# Patient Record
Sex: Female | Born: 2000 | Race: White | Hispanic: Yes | Marital: Single | Smoking: Former smoker
Health system: Southern US, Community
[De-identification: ages and names within clinical notes are randomized; demographics above are authoritative.]

## PROBLEM LIST (undated history)

## (undated) DIAGNOSIS — D509 Iron deficiency anemia, unspecified: Secondary | ICD-10-CM

## (undated) HISTORY — DX: Iron deficiency anemia, unspecified: D50.9

## (undated) HISTORY — PX: NO PAST SURGERIES: SHX2092

---
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
## 2020-07-21 ENCOUNTER — Encounter (HOSPITAL_COMMUNITY): Payer: Self-pay

## 2020-07-21 ENCOUNTER — Other Ambulatory Visit: Payer: Self-pay

## 2020-07-21 ENCOUNTER — Emergency Department (HOSPITAL_COMMUNITY)
Admission: EM | Admit: 2020-07-21 | Discharge: 2020-07-21 | Disposition: A | Discharge status: Home / Self Care | Payer: Medicaid Other | Attending: Emergency Medicine | Admitting: Emergency Medicine

## 2020-07-21 ENCOUNTER — Emergency Department (HOSPITAL_COMMUNITY): Payer: Medicaid Other

## 2020-07-21 DIAGNOSIS — Z87891 Personal history of nicotine dependence: Secondary | ICD-10-CM | POA: Insufficient documentation

## 2020-07-21 DIAGNOSIS — R079 Chest pain, unspecified: Secondary | ICD-10-CM | POA: Insufficient documentation

## 2020-07-21 DIAGNOSIS — Z20822 Contact with and (suspected) exposure to covid-19: Secondary | ICD-10-CM | POA: Diagnosis not present

## 2020-07-21 DIAGNOSIS — R519 Headache, unspecified: Secondary | ICD-10-CM | POA: Diagnosis not present

## 2020-07-21 DIAGNOSIS — R0981 Nasal congestion: Secondary | ICD-10-CM | POA: Insufficient documentation

## 2020-07-21 DIAGNOSIS — R110 Nausea: Secondary | ICD-10-CM | POA: Diagnosis not present

## 2020-07-21 LAB — RESPIRATORY PANEL BY RT PCR (FLU A&B, COVID)
Influenza A by PCR: NEGATIVE
Influenza B by PCR: NEGATIVE
SARS Coronavirus 2 by RT PCR: NEGATIVE

## 2020-07-21 LAB — CBC
HCT: 34.1 % — ABNORMAL LOW (ref 36.0–46.0)
Hemoglobin: 10.3 g/dL — ABNORMAL LOW (ref 12.0–15.0)
MCH: 24 pg — ABNORMAL LOW (ref 26.0–34.0)
MCHC: 30.2 g/dL (ref 30.0–36.0)
MCV: 79.3 fL — ABNORMAL LOW (ref 80.0–100.0)
Platelets: 277 10*3/uL (ref 150–400)
RBC: 4.3 MIL/uL (ref 3.87–5.11)
RDW: 16.4 % — ABNORMAL HIGH (ref 11.5–15.5)
WBC: 6.2 10*3/uL (ref 4.0–10.5)
nRBC: 0 % (ref 0.0–0.2)

## 2020-07-21 LAB — BASIC METABOLIC PANEL
Anion gap: 8 (ref 5–15)
BUN: 9 mg/dL (ref 6–20)
CO2: 25 mmol/L (ref 22–32)
Calcium: 8.6 mg/dL — ABNORMAL LOW (ref 8.9–10.3)
Chloride: 106 mmol/L (ref 98–111)
Creatinine, Ser: 0.41 mg/dL — ABNORMAL LOW (ref 0.44–1.00)
GFR, Estimated: >60 mL/min (ref >60)
Glucose, Bld: 97 mg/dL (ref 70–99)
Potassium: 4.1 mmol/L (ref 3.5–5.1)
Sodium: 139 mmol/L (ref 135–145)

## 2020-07-21 LAB — TROPONIN I (HIGH SENSITIVITY)
Troponin I (High Sensitivity): <2 ng/L (ref <18)
Troponin I (High Sensitivity): 2 ng/L (ref <18)

## 2020-07-21 LAB — I-STAT BETA HCG BLOOD, ED (MC, WL, AP ONLY): I-stat hCG, quantitative: <5 m[IU]/mL (ref <5)

## 2020-07-21 MED ORDER — PROCHLORPERAZINE EDISYLATE 10 MG/2ML IJ SOLN
10.0000 mg | Freq: Once | INTRAMUSCULAR | Status: AC
  Administered 2020-07-21: 10 mg via INTRAVENOUS
  Filled 2020-07-21: qty 2

## 2020-07-21 MED ORDER — DIPHENHYDRAMINE HCL 50 MG/ML IJ SOLN
25.0000 mg | Freq: Once | INTRAMUSCULAR | Status: AC
  Administered 2020-07-21: 25 mg via INTRAVENOUS
  Filled 2020-07-21: qty 1

## 2020-07-21 NOTE — ED Triage Notes (Addendum)
Pt presents with c/o headache since 4 am, unrelieved by pain reliever. Pt also reports chest pain that started around 11 am.

## 2020-07-21 NOTE — ED Provider Notes (Signed)
[facility]-EMERGENCY DEPT Provider Note   CSN: [id number] Arrival date & time: 07/21/20  1228     History Chief Complaint  Patient presents with  . Headache  . Chest Pain    Paula [facility] is a 19 y.o. female.  HPI      Woke with a headache this morning at 4AM Worsened and at ;7AM was 9/10 in severity Chest pain started at 11AM, is 3/10 now  Pressure like headache, took ibuprofen which helped for a little but it came back NO falls or trauma, no fever, Denies numbness, weakness, difficulty talking or walking, visual changes or facial droop. Nausea this AM, no vomiting Has had some headaches in the past but not this severe WHen she began having CP, discussed with her mom who recommended  Coming to the ED  Pressure and sharp pain in middle of chest, radiation to the back some, a little worse with breathing and changing positions, worse with the movements but not particular position-action of layign down or sitting up No dyspnea, nausea,  Heart raciing, leg pain or swelling  No fam hx of early heart attacks, blood clots, aneurysms  No smoking, no other drugs, no OCP, no recent travel or surgeries  Mild runny nose when woke this AM. No cough, sore throat, body aches Change in appetite for about a week No sick contacts  Past Medical History:  Diagnosis Date  . Microcytic anemia 09/12/2018    Patient Active Problem List   Diagnosis Date Noted  . Numbness and tingling 09/17/2018  . Microcytic anemia 09/12/2018  . Birth control counseling 02/26/2017  . 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 Insulin resistance 01/06/2014  . Hyperlipidemia 03/16/2012  . HEARING LOSS, LEFT EAR 02/08/2010  . CHILDHOOD OBESITY 12/11/2009    History reviewed. No pertinent surgical history.   OB History   No obstetric history on file.     Family History  Problem Relation Age of Onset  . Diabetes Mother   . Healthy Father     Social History    Tobacco Use  . Smoking status: Former Games developer  . Smokeless tobacco: Never Used  . Tobacco comment: pt states it has been months  Vaping Use  . Vaping Use: Never used  Substance Use Topics  . Alcohol use: Not Currently  . Drug use: Not Currently    Types: Marijuana    Home Medications Prior to Admission medications   Medication Sig Start Date End Date Taking? Authorizing Provider  cephALEXin (KEFLEX) 500 MG capsule Take 1 capsule (500 mg total) by mouth 2 (two) times daily. 05/27/19   Roxy Horseman, PA-C  ferrous sulfate 325 (65 FE) MG tablet Take 1 tablet (325 mg total) by mouth daily. Patient not taking: Reported on 05/27/2019 10/07/18   Bethel Born, PA-C  Multiple Vitamins-Iron (MULTIVITAMINS WITH IRON) TABS tablet Take 1 tablet by mouth daily. Patient not taking: Reported on 05/27/2019 09/12/18   Lorin Picket, NP    Allergies    Patient has no known allergies.  Review of Systems   Review of Systems  Constitutional: Positive for appetite change. Negative for fever.  HENT: Positive for rhinorrhea. Negative for congestion and sore throat.   Eyes: Negative for visual disturbance.  Respiratory: Negative for cough and shortness of breath.   Cardiovascular: Positive for chest pain.  Gastrointestinal: Positive for nausea. Negative for abdominal pain, diarrhea and vomiting.  Genitourinary: Negative for difficulty urinating.  Musculoskeletal: Negative for back pain and neck  pain.  Skin: Negative for rash.  Neurological: Positive for headaches. Negative for dizziness, syncope, facial asymmetry and weakness.    Physical Exam Updated Vital Signs BP 138/75   Pulse (!) 119   Temp 98.4 F (36.9 C) (Oral)   Resp 18   Ht 5\' 3"  (1.6 m)   Wt 99.8 kg   LMP 07/13/2020 (Approximate)   SpO2 (!) 89%   BMI 38.97 kg/m   Physical Exam Vitals and nursing note reviewed.  Constitutional:      General: She is not in acute distress.    Appearance: She is well-developed. She is  not diaphoretic.  HENT:     Head: Normocephalic and atraumatic.  Eyes:     Conjunctiva/sclera: Conjunctivae normal.  Cardiovascular:     Rate and Rhythm: Normal rate and regular rhythm.     Heart sounds: Normal heart sounds. No murmur heard.  No friction rub. No gallop.   Pulmonary:     Effort: Pulmonary effort is normal. No respiratory distress.     Breath sounds: Normal breath sounds. No wheezing or rales.  Abdominal:     General: There is no distension.     Palpations: Abdomen is soft.     Tenderness: There is no abdominal tenderness. There is no guarding.  Musculoskeletal:        General: No tenderness.     Cervical back: Normal range of motion.  Skin:    General: Skin is warm and dry.     Findings: No erythema or rash.  Neurological:     Mental Status: She is alert and oriented to person, place, and time.     ED Results / Procedures / Treatments   Labs (all labs ordered are listed, but only abnormal results are displayed) Labs Reviewed  BASIC METABOLIC PANEL - Abnormal; Notable for the following components:      Result Value   Creatinine, Ser 0.41 (*)    Calcium 8.6 (*)    All other components within normal limits  CBC - Abnormal; Notable for the following components:   Hemoglobin 10.3 (*)    HCT 34.1 (*)    MCV 79.3 (*)    MCH 24.0 (*)    RDW 16.4 (*)    All other components within normal limits  RESPIRATORY PANEL BY RT PCR (FLU A&B, COVID)  I-STAT BETA HCG BLOOD, ED ([facility], [facility], [facility] ONLY)  TROPONIN I (HIGH SENSITIVITY)  TROPONIN I (HIGH SENSITIVITY)    EKG EKG Interpretation  Date/Time:  Friday July 21 2020 12:46:02 EDT Ventricular Rate:  74 PR Interval:    QRS Duration: 99 QT Interval:  386 QTC Calculation: 429 R Axis:   64 Text Interpretation: Sinus rhythm Baseline wander in lead(s) V4 Similar QW prior ECG Confirmed by [id number] (Alvira Monday) on 07/21/2020 1:05:56 PM   Radiology DG Chest 2 View  Result Date: 07/21/2020 CLINICAL DATA:  Chest  pain.  Headache. EXAM: CHEST - 2 VIEW COMPARISON:  None. FINDINGS: The heart size and mediastinal contours are within normal limits. Both lungs are clear. The visualized skeletal structures are unremarkable. IMPRESSION: Normal examination. Electronically Signed   By: 13/01/2020 M.D.   On: 07/21/2020 13:47    Procedures Procedures (including critical care time)  Medications Ordered in ED Medications  prochlorperazine (COMPAZINE) injection 10 mg (10 mg Intravenous Given 07/21/20 1436)  diphenhydrAMINE (BENADRYL) injection 25 mg (25 mg Intravenous Given 07/21/20 1436)    ED Course  I have reviewed the triage vital signs and  the nursing notes.  Pertinent labs & imaging results that were available during my care of the patient were reviewed by me and considered in my medical decision making (see chart for details).    MDM Rules/Calculators/A&P                          19 year old female with history of hyperlipidemia and insulin resistence presents with concern of chest pain. Differential diagnosis for chest pain includes pulmonary embolus, dissection, pneumothorax, pneumonia, ACS, myocarditis, pericarditis.  EKG was done and evaluate by me and showed no acute ST changes and no signs of pericarditis. Chest x-ray was done and evaluated by me and radiology and showed no sign of pneumonia or pneumothorax. Patient is PERC negative and low risk Wells and have low suspicion for PE.  Patient is low risk HEART score and had delta troponins which were both negative.  Do not feel history or exam are consistent with aortic dissection. Also reports headache---Headache began slowly, no trauma, no fevers, and normal neurologic exam and have low suspicion for [facility], SDH or meningitis.  Patient was given compazine and benadryl with improvement in headache. COVID 19 testing negative. Discussed all results and recommend outpt follow up. Patient discharged in stable condition with understanding of reasons to return.       Final Clinical Impression(s) / ED Diagnoses Final diagnoses:  Chest pain, unspecified type  Acute nonintractable headache, unspecified headache type    Rx / DC Orders ED Discharge Orders    None       Alvira Monday, MD 07/22/20 2354

## 2020-07-24 ENCOUNTER — Telehealth: Payer: Self-pay

## 2020-07-24 NOTE — Telephone Encounter (Signed)
Transition Care Management Unsuccessful Follow-up Telephone Call  Date of discharge and from where:  07/21/2020 [facility]  Attempts:  1st Attempt  Reason for unsuccessful TCM follow-up call:  Left voice message

## 2020-07-25 NOTE — Telephone Encounter (Signed)
Transition Care Management Unsuccessful Follow-up Telephone Call  Date of discharge and from where:  07/21/2020 [facility]  Attempts:  2nd Attempt  Reason for unsuccessful TCM follow-up call:  Left voice message

## 2020-07-26 NOTE — Telephone Encounter (Signed)
Transition Care Management Unsuccessful Follow-up Telephone Call  Date of discharge and from where:  07/21/2020 [facility]  Attempts:  3rd Attempt  Reason for unsuccessful TCM follow-up call:  Left voice message

## 2020-10-24 DIAGNOSIS — H5213 Myopia, bilateral: Secondary | ICD-10-CM | POA: Diagnosis not present

## 2020-11-28 DIAGNOSIS — H5213 Myopia, bilateral: Secondary | ICD-10-CM | POA: Diagnosis not present

## 2020-11-28 DIAGNOSIS — H52223 Regular astigmatism, bilateral: Secondary | ICD-10-CM | POA: Diagnosis not present

## 2020-12-31 ENCOUNTER — Emergency Department (HOSPITAL_COMMUNITY): Payer: Medicaid Other

## 2020-12-31 ENCOUNTER — Encounter (HOSPITAL_COMMUNITY): Payer: Self-pay

## 2020-12-31 ENCOUNTER — Emergency Department (HOSPITAL_COMMUNITY)
Admission: EM | Admit: 2020-12-31 | Discharge: 2020-12-31 | Disposition: A | Discharge status: Home / Self Care | Payer: Medicaid Other | Attending: Emergency Medicine | Admitting: Emergency Medicine

## 2020-12-31 ENCOUNTER — Other Ambulatory Visit: Payer: Self-pay

## 2020-12-31 DIAGNOSIS — R0602 Shortness of breath: Secondary | ICD-10-CM | POA: Diagnosis not present

## 2020-12-31 DIAGNOSIS — M545 Low back pain, unspecified: Secondary | ICD-10-CM | POA: Diagnosis not present

## 2020-12-31 DIAGNOSIS — Z87891 Personal history of nicotine dependence: Secondary | ICD-10-CM | POA: Diagnosis not present

## 2020-12-31 DIAGNOSIS — R079 Chest pain, unspecified: Secondary | ICD-10-CM | POA: Diagnosis not present

## 2020-12-31 DIAGNOSIS — R0600 Dyspnea, unspecified: Secondary | ICD-10-CM | POA: Diagnosis not present

## 2020-12-31 DIAGNOSIS — M5459 Other low back pain: Secondary | ICD-10-CM | POA: Diagnosis not present

## 2020-12-31 LAB — URINALYSIS, ROUTINE W REFLEX MICROSCOPIC
Bacteria, UA: NONE SEEN
Bilirubin Urine: NEGATIVE
Glucose, UA: NEGATIVE mg/dL
Ketones, ur: NEGATIVE mg/dL
Nitrite: NEGATIVE
Protein, ur: NEGATIVE mg/dL
Specific Gravity, Urine: >1.046 — ABNORMAL HIGH (ref 1.005–1.030)
pH: 5 (ref 5.0–8.0)

## 2020-12-31 LAB — BASIC METABOLIC PANEL
Anion gap: 7 (ref 5–15)
BUN: 11 mg/dL (ref 6–20)
CO2: 25 mmol/L (ref 22–32)
Calcium: 9.1 mg/dL (ref 8.9–10.3)
Chloride: 107 mmol/L (ref 98–111)
Creatinine, Ser: 0.46 mg/dL (ref 0.44–1.00)
GFR, Estimated: >60 mL/min (ref >60)
Glucose, Bld: 93 mg/dL (ref 70–99)
Potassium: 4.5 mmol/L (ref 3.5–5.1)
Sodium: 139 mmol/L (ref 135–145)

## 2020-12-31 LAB — CBC
HCT: 35.7 % — ABNORMAL LOW (ref 36.0–46.0)
Hemoglobin: 11 g/dL — ABNORMAL LOW (ref 12.0–15.0)
MCH: 24.3 pg — ABNORMAL LOW (ref 26.0–34.0)
MCHC: 30.8 g/dL (ref 30.0–36.0)
MCV: 79 fL — ABNORMAL LOW (ref 80.0–100.0)
Platelets: 273 10*3/uL (ref 150–400)
RBC: 4.52 MIL/uL (ref 3.87–5.11)
RDW: 16.4 % — ABNORMAL HIGH (ref 11.5–15.5)
WBC: 6.4 10*3/uL (ref 4.0–10.5)
nRBC: 0 % (ref 0.0–0.2)

## 2020-12-31 LAB — D-DIMER, QUANTITATIVE: D-Dimer, Quant: 0.55 ug/mL-FEU — ABNORMAL HIGH (ref 0.00–0.50)

## 2020-12-31 LAB — PREGNANCY, URINE: Preg Test, Ur: NEGATIVE

## 2020-12-31 MED ORDER — IOHEXOL 350 MG/ML SOLN
100.0000 mL | Freq: Once | INTRAVENOUS | Status: AC | PRN
  Administered 2020-12-31: 100 mL via INTRAVENOUS

## 2020-12-31 MED ORDER — NAPROXEN 500 MG PO TABS: 500.0000 mg | ORAL_TABLET | Freq: Two times a day (BID) | ORAL | 0 refills | Status: DC

## 2020-12-31 MED ORDER — CYCLOBENZAPRINE HCL 10 MG PO TABS: 10.0000 mg | ORAL_TABLET | Freq: Three times a day (TID) | ORAL | 0 refills | Status: DC

## 2020-12-31 NOTE — Discharge Instructions (Signed)
Work-up for the shortness of breath without any acute findings.  Chest x-ray negative.  CT angio chest negative for any blood clots in the lungs.  Urinalysis questionable urinary tract infection but most likely contamination.  Sent for culture.  To be contacted if it is showing a significant infection.  X-rays of the low part of the back without any bony abnormalities.  Take the Naprosyn and the Flexeril as directed for the next 7 days.  Return for any new or worse symptoms or things are not improving.

## 2020-12-31 NOTE — ED Triage Notes (Addendum)
Patient c/o lower back pain x 2-3 weeks. Patient also c/o SOB and chest pain ( x 1 episode) x 1 week.

## 2020-12-31 NOTE — ED Notes (Signed)
Pt states chest pain was 2 weeks ago not today

## 2020-12-31 NOTE — ED Provider Notes (Signed)
[facility]   CSN: [id number] Arrival date & time: 12/31/20  1519     History Chief Complaint  Patient presents with  . Back Pain  . Chest Pain  . Shortness of Breath    Paula [facility] is a 20 y.o. female.  Patient with a complaint of low back pain for about 3 weeks.  But worse in the past few days and shortness of breath started yesterday.  Patient had an episode of chest pain about 2 weeks ago has had none since.  Patient denies any numbness or weakness or any pain radiating into her legs.  No fall or injury.        Past Medical History:  Diagnosis Date  . Microcytic anemia 09/12/2018    Patient Active Problem List   Diagnosis Date Noted  . Numbness and tingling 09/17/2018  . Microcytic anemia 09/12/2018  . Birth control counseling 02/26/2017  . 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 Insulin resistance 01/06/2014  . Hyperlipidemia 03/16/2012  . HEARING LOSS, LEFT EAR 02/08/2010  . CHILDHOOD OBESITY 12/11/2009    History reviewed. No pertinent surgical history.   OB History   No obstetric history on file.     Family History  Problem Relation Age of Onset  . Diabetes Mother   . Healthy Father     Social History   Tobacco Use  . Smoking status: Former Games developer  . Smokeless tobacco: Never Used  . Tobacco comment: pt states it has been months  Vaping Use  . Vaping Use: Every day  . Substances: Nicotine, Flavoring  Substance Use Topics  . Alcohol use: Not Currently  . Drug use: Not Currently    Types: Marijuana    Home Medications Prior to Admission medications   Medication Sig Start Date End Date Taking? Authorizing Provider  cephALEXin (KEFLEX) 500 MG capsule Take 1 capsule (500 mg total) by mouth 2 (two) times daily. 05/27/19   Roxy Horseman, PA-C  ferrous sulfate 325 (65 FE) MG tablet Take 1 tablet (325 mg total) by mouth daily. Patient not taking: Reported on 05/27/2019 10/07/18    Bethel Born, PA-C  Multiple Vitamins-Iron (MULTIVITAMINS WITH IRON) TABS tablet Take 1 tablet by mouth daily. Patient not taking: Reported on 05/27/2019 09/12/18   Lorin Picket, NP    Allergies    Patient has no known allergies.  Review of Systems   Review of Systems  Constitutional: Negative for chills and fever.  HENT: Negative for rhinorrhea and sore throat.   Eyes: Negative for visual disturbance.  Respiratory: Positive for shortness of breath. Negative for cough.   Cardiovascular: Negative for chest pain and leg swelling.  Gastrointestinal: Negative for abdominal pain, diarrhea, nausea and vomiting.  Genitourinary: Negative for dysuria.  Musculoskeletal: Positive for back pain. Negative for neck pain.  Skin: Negative for rash.  Neurological: Negative for dizziness, light-headedness and headaches.  Hematological: Does not bruise/bleed easily.  Psychiatric/Behavioral: Negative for confusion.    Physical Exam Updated Vital Signs BP 123/86 (BP Location: Left Arm)   Pulse 75   Temp 98.4 F (36.9 C) (Oral)   Resp 16   Ht 1.6 m (5\' 3" )   Wt 119.3 kg   LMP 12/27/2020   SpO2 100%   BMI 46.59 kg/m   Physical Exam Vitals and nursing note reviewed.  Constitutional:      General: She is not in acute distress.    Appearance: Normal appearance. She is well-developed.  HENT:  Head: Normocephalic and atraumatic.  Eyes:     Extraocular Movements: Extraocular movements intact.     Conjunctiva/sclera: Conjunctivae normal.     Pupils: Pupils are equal, round, and reactive to light.  Cardiovascular:     Rate and Rhythm: Normal rate and regular rhythm.     Heart sounds: No murmur heard.   Pulmonary:     Effort: Pulmonary effort is normal. No respiratory distress.     Breath sounds: Normal breath sounds.  Abdominal:     Palpations: Abdomen is soft.     Tenderness: There is no abdominal tenderness.  Musculoskeletal:        General: No swelling. Normal range of  motion.     Cervical back: Normal range of motion and neck supple.     Comments: Bilateral paraspinous tenderness to the lumbar back.  Lower extremities without any pain with range of motion.  No weakness or numbness to the feet.  Good cap refill.  Skin:    General: Skin is warm and dry.     Capillary Refill: Capillary refill takes less than 2 seconds.  Neurological:     General: No focal deficit present.     Mental Status: She is alert and oriented to person, place, and time.     Cranial Nerves: No cranial nerve deficit.     Sensory: No sensory deficit.     Motor: No weakness.     ED Results / Procedures / Treatments   Labs (all labs ordered are listed, but only abnormal results are displayed) Labs Reviewed - No data to display  EKG None  Radiology No results found.  Procedures Procedures   Medications Ordered in ED Medications - No data to display  ED Course  I have reviewed the triage vital signs and the nursing notes.  Pertinent labs & imaging results that were available during my care of the patient were reviewed by me and considered in my medical decision making (see chart for details).    MDM Rules/Calculators/A&P                          Patient work-up for the shortness of breath chest x-ray was negative.  The D-dimer was elevated so CT angio was done without evidence of any acute findings or pulmonary embolus.  Urinalysis appears to be perhaps contaminated.  Sent for culture patient will be notified if it is consistent with a urinary tract infection.  Pregnancy test negative.  No acute findings regarding the shortness of breath.  For the low back lumbar x-rays were done without any acute findings.  Patient not showing any evidence of sciatica.  CBC and labs without any significant abnormality.  For the back patient will be treated symptomatically with Naprosyn and Flexeril.  Patient does not need a work note.  Patient will return for any new or worse  symptoms.  Patient knows she will be notified if the urine culture grows significant amount of bacteria consistent with infection peer Final Clinical Impression(s) / ED Diagnoses Final diagnoses:  None    Rx / DC Orders ED Discharge Orders    None       Vanetta Mulders, MD 12/31/20 2021

## 2021-01-01 ENCOUNTER — Telehealth: Payer: Self-pay | Admitting: *Deleted

## 2021-01-01 NOTE — Telephone Encounter (Signed)
Transition Care Management Follow-up Telephone Call  Date of discharge and from where: 12/31/2020 - [facility]  How have you been since you were released from the hospital? "Still in some pain"  Any questions or concerns? No  Items Reviewed:  Did the pt receive and understand the discharge instructions provided? Yes   Medications obtained and verified? Yes   Other? No   Any new allergies since your discharge? No   Dietary orders reviewed? No  Do you have support at home? Yes     Functional Questionnaire: (I = Independent and D = Dependent) ADLs: I  Bathing/Dressing- I  Meal Prep- I  Eating- I  Maintaining continence- I  Transferring/Ambulation- I  Managing Meds- I  Follow up appointments reviewed:   PCP Hospital f/u appt confirmed? No    Specialist Hospital f/u appt confirmed? No    Are transportation arrangements needed? No   If their condition worsens, is the pt aware to call PCP or go to the Emergency Dept.? Yes  Was the patient provided with contact information for the PCP's office or ED? Yes  Was to pt encouraged to call back with questions or concerns? Yes

## 2021-01-02 LAB — URINE CULTURE

## 2021-04-26 ENCOUNTER — Encounter (HOSPITAL_COMMUNITY): Payer: Self-pay

## 2021-04-26 ENCOUNTER — Emergency Department (HOSPITAL_COMMUNITY)
Admission: EM | Admit: 2021-04-26 | Discharge: 2021-04-26 | Disposition: A | Discharge status: Home / Self Care | Payer: Medicaid Other | Attending: Student | Admitting: Student

## 2021-04-26 ENCOUNTER — Emergency Department (HOSPITAL_COMMUNITY): Payer: Medicaid Other

## 2021-04-26 ENCOUNTER — Other Ambulatory Visit: Payer: Self-pay

## 2021-04-26 DIAGNOSIS — O4690 Antepartum hemorrhage, unspecified, unspecified trimester: Secondary | ICD-10-CM

## 2021-04-26 DIAGNOSIS — O23591 Infection of other part of genital tract in pregnancy, first trimester: Secondary | ICD-10-CM | POA: Insufficient documentation

## 2021-04-26 DIAGNOSIS — R102 Pelvic and perineal pain: Secondary | ICD-10-CM | POA: Insufficient documentation

## 2021-04-26 DIAGNOSIS — O418X90 Other specified disorders of amniotic fluid and membranes, unspecified trimester, not applicable or unspecified: Secondary | ICD-10-CM

## 2021-04-26 DIAGNOSIS — N8312 Corpus luteum cyst of left ovary: Secondary | ICD-10-CM | POA: Diagnosis not present

## 2021-04-26 DIAGNOSIS — O208 Other hemorrhage in early pregnancy: Secondary | ICD-10-CM | POA: Diagnosis present

## 2021-04-26 DIAGNOSIS — O368910 Maternal care for other specified fetal problems, first trimester, not applicable or unspecified: Secondary | ICD-10-CM | POA: Diagnosis not present

## 2021-04-26 DIAGNOSIS — Z87891 Personal history of nicotine dependence: Secondary | ICD-10-CM | POA: Insufficient documentation

## 2021-04-26 DIAGNOSIS — O209 Hemorrhage in early pregnancy, unspecified: Secondary | ICD-10-CM | POA: Diagnosis not present

## 2021-04-26 DIAGNOSIS — Z3A01 Less than 8 weeks gestation of pregnancy: Secondary | ICD-10-CM | POA: Diagnosis not present

## 2021-04-26 LAB — HCG, QUANTITATIVE, PREGNANCY: hCG, Beta Chain, Quant, S: 66057 m[IU]/mL — ABNORMAL HIGH (ref <5)

## 2021-04-26 LAB — URINALYSIS, ROUTINE W REFLEX MICROSCOPIC
Bilirubin Urine: NEGATIVE
Glucose, UA: NEGATIVE mg/dL
Ketones, ur: 20 mg/dL — AB
Nitrite: NEGATIVE
Protein, ur: NEGATIVE mg/dL
Specific Gravity, Urine: 1.028 (ref 1.005–1.030)
pH: 5 (ref 5.0–8.0)

## 2021-04-26 LAB — I-STAT CHEM 8, ED
BUN: 12 mg/dL (ref 6–20)
Calcium, Ion: 1.26 mmol/L (ref 1.15–1.40)
Chloride: 103 mmol/L (ref 98–111)
Creatinine, Ser: 0.4 mg/dL — ABNORMAL LOW (ref 0.44–1.00)
Glucose, Bld: 91 mg/dL (ref 70–99)
HCT: 33 % — ABNORMAL LOW (ref 36.0–46.0)
Hemoglobin: 11.2 g/dL — ABNORMAL LOW (ref 12.0–15.0)
Potassium: 3.9 mmol/L (ref 3.5–5.1)
Sodium: 137 mmol/L (ref 135–145)
TCO2: 24 mmol/L (ref 22–32)

## 2021-04-26 LAB — I-STAT BETA HCG BLOOD, ED (MC, WL, AP ONLY): I-stat hCG, quantitative: >2000 m[IU]/mL — ABNORMAL HIGH (ref <5)

## 2021-04-26 LAB — WET PREP, GENITAL
Clue Cells Wet Prep HPF POC: NONE SEEN
Sperm: NONE SEEN
Trich, Wet Prep: NONE SEEN
WBC, Wet Prep HPF POC: NONE SEEN
Yeast Wet Prep HPF POC: NONE SEEN

## 2021-04-26 LAB — HCG, SERUM, QUALITATIVE: Preg, Serum: POSITIVE — AB

## 2021-04-26 LAB — ABO/RH: ABO/RH(D): A POS

## 2021-04-26 MED ORDER — CEPHALEXIN 500 MG PO CAPS: 500.0000 mg | ORAL_CAPSULE | Freq: Four times a day (QID) | ORAL | 0 refills | Status: AC

## 2021-04-26 NOTE — ED Triage Notes (Signed)
Pt reports + pregnancy test yesterday and woke up this am with spotting when using restroom with lower abd pain.

## 2021-04-26 NOTE — ED Notes (Signed)
Two Gold Top Tubes sent to lab.

## 2021-04-26 NOTE — ED Provider Notes (Addendum)
Emergency Medicine Provider Triage Evaluation Note  [facility] , a 20 y.o. female  was evaluated in triage.  Pt complains of vaginal bleeding and mild pelvic pain (resolved). She took some at home pregnancy tests and they were positive. LMP 6/21.  Review of Systems  Positive: Vaginal bleeding, pelvic pain Negative: fever  Physical Exam  BP (!) 144/97   Pulse 72   Temp 98.4 F (36.9 C)   Resp 20   Ht 5\' 3"  (1.6 m)   Wt 119 kg   LMP 12/27/2020 Comment: neg preg test  SpO2 100%   BMI 46.47 kg/m  Gen:   Awake, no distress   Resp:  Normal effort  MSK:   Moves extremities without difficulty    Medical Decision Making  Medically screening exam initiated at 1:07 PM.  Appropriate orders placed.  12/29/2020 was informed that the remainder of the evaluation will be completed by another provider, this initial triage assessment does not replace that evaluation, and the importance of remaining in the ED until their evaluation is complete.     Paula Parsley, PA-C 04/26/21 1308    06/26/21, PA-C 04/26/21 1900    06/26/21, MD 04/28/21 [id number]

## 2021-04-26 NOTE — Discharge Instructions (Addendum)
You were noted to have an intrauterine pregnancy.  You will need to follow-up with an OB/GYN to have a repeat beta quant completed.  You did have some white blood cells in your urine today and we are going to treat you for potential urinary tract infection with antibiotics.  A culture will be sent of your urine and if you need to change or stop antibiotics in the hospital will contact you.  Additionally you were tested for gonorrhea and chlamydia and the results will be available in the next 3 to 5 days.  Your trichomonas, yeast and bacterial vaginosis tests were negative.  Please start taking a prenatal vitamin.  You can buy these over-the-counter.  Please contact the women's health care center to schedule an appointment for follow-up and return to the emergency department for any new or worsening symptoms in the meantime.

## 2021-04-26 NOTE — ED Notes (Signed)
Lavender and Light Green top tube sent to lab.

## 2021-04-26 NOTE — ED Provider Notes (Signed)
Paula Noble   CSN: [id number] Arrival date & time: 04/26/21  1236     History Chief Complaint  Patient presents with   Vaginal Bleeding    Paula Noble is a 20 y.o. female.  HPI  20 year old female presents to the emergency department today for evaluation of vaginal bleeding.  States that she took several pregnancy tests at home that were positive.  She did started having some spotting today.  Denies any heavy bleeding.  Denies any recent sexual intercourse.  Her last menstrual cycle was in June of this year.  This to be her first pregnancy.  She denies any vaginal discharge, urinary symptoms, abdominal pain nausea or vomiting.  She has had some pelvic cramping which has been mild.  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 No pertinent surgical history.   OB History     Gravida  1   Para      Term      Preterm      AB      Living         SAB      IAB      Ectopic      Multiple      Live Births              Family History  Problem Relation Age of Onset   Diabetes Mother    Healthy Father     Social History   Tobacco Use   Smoking status: Former   Smokeless tobacco: Never   Tobacco comments:    pt states it has been months  Vaping Use   Vaping Use: Every day   Substances: Nicotine, Flavoring  Substance Use Topics   Alcohol use: Not Currently   Drug use: Not Currently    Types: Marijuana    Home Medications Prior to Admission medications   Medication Sig Start Date End Date Taking? Authorizing Provider   cephALEXin (KEFLEX) 500 MG capsule Take 1 capsule (500 mg total) by mouth 4 (four) times daily for 7 days. 04/26/21 05/03/21 Yes Tilla Wilborn S, PA-C  cephALEXin (KEFLEX) 500 MG capsule Take 1 capsule (500 mg total) by mouth 2 (two) times daily. Patient not taking: Reported on 12/31/2020 05/27/19   Roxy Horseman, PA-C  cyclobenzaprine (FLEXERIL) 10 MG tablet Take 1 tablet (10 mg total) by mouth 3 (three) times daily. 12/31/20   Vanetta Mulders, MD  ferrous sulfate 325 (65 FE) MG tablet Take 1 tablet (325 mg total) by mouth daily. Patient not taking: No sig reported 10/07/18   Bethel Born, PA-C  Multiple Vitamins-Iron (MULTIVITAMINS WITH IRON) TABS tablet Take 1 tablet by mouth daily. Patient not taking: No sig reported 09/12/18   Lorin Picket, NP  naproxen (NAPROSYN) 500 MG tablet Take 1 tablet (500 mg total) by mouth 2 (two) times daily. 12/31/20   Vanetta Mulders, MD    Allergies    Patient has no known allergies.  Review of Systems   Review of Systems  Constitutional:  Negative for fever.  Eyes:  Negative for visual disturbance.  Respiratory:  Negative for shortness of breath.   Cardiovascular:  Negative for chest pain.  Gastrointestinal:  Negative for abdominal pain, diarrhea, nausea and vomiting.  Genitourinary:  Positive for vaginal bleeding. Negative for dysuria, pelvic pain and vaginal discharge.  Musculoskeletal:  Negative for back pain.   Physical Exam Updated Vital Signs BP 115/78   Pulse 64   Temp 98 F (36.7 C) (Oral)   Resp 18   Ht 5\' 3"  (1.6 m)   Wt 119 kg   LMP 12/27/2020 Comment: neg preg test  SpO2 100%   BMI 46.47 kg/m   Physical Exam Vitals and nursing Noble reviewed.  Constitutional:      General: She is not in acute distress.    Appearance: She is well-developed.  HENT:     Head: Normocephalic and atraumatic.  Eyes:     Conjunctiva/sclera: Conjunctivae normal.  Cardiovascular:     Rate and Rhythm: Normal rate and regular rhythm.      Heart sounds: Normal heart sounds. No murmur heard. Pulmonary:     Effort: Pulmonary effort is normal. No respiratory distress.     Breath sounds: Normal breath sounds.  Abdominal:     General: Bowel sounds are normal.     Palpations: Abdomen is soft.     Tenderness: There is no abdominal tenderness.  Genitourinary:    Comments: Exam performed by 12/29/2020,  exam chaperoned Date: 04/26/2021 Pelvic exam: normal external genitalia without evidence of trauma. VULVA: normal appearing vulva with no masses, tenderness or lesion. VAGINA: normal appearing vagina with normal color and discharge, no lesions. CERVIX: normal appearing cervix without lesions, cervical motion tenderness absent, cervical os closed with out purulent discharge; vaginal discharge - none, scant vaginal bleeding present, Wet prep and DNA probe for chlamydia and GC obtained.   ADNEXA: normal adnexa in size, nontender and no masses UTERUS: uterus is normal size, shape, consistency and nontender.   Musculoskeletal:     Cervical back: Neck supple.  Skin:    General: Skin is warm and dry.  Neurological:     Mental Status: She is alert.    ED Results / Procedures / Treatments   Labs (all labs ordered are listed, but only abnormal results are displayed) Labs Reviewed  HCG, SERUM, QUALITATIVE - Abnormal; Notable for the following components:      Result Value   Preg, Serum POSITIVE (*)    All other components within normal limits  URINALYSIS, ROUTINE W REFLEX MICROSCOPIC - Abnormal; Notable for the following components:   APPearance HAZY (*)    Hgb urine dipstick LARGE (*)    Ketones, ur 20 (*)    Leukocytes,Ua TRACE (*)    Bacteria, UA RARE (*)    All other components within normal limits  HCG, QUANTITATIVE, PREGNANCY - Abnormal; Notable for the following components:   hCG, Beta Chain, Quant, S 66,057 (*)    All other components within normal limits  I-STAT BETA HCG BLOOD, ED ([facility], [facility], [facility] ONLY) - Abnormal;  Notable for the following components:   I-stat hCG, quantitative >2,000.0 (*)    All other components within normal limits  I-STAT CHEM 8, ED - Abnormal; Notable for the following components:   Creatinine, Ser 0.40 (*)    Hemoglobin 11.2 (*)    HCT 33.0 (*)    All other components within normal limits  WET PREP, GENITAL  URINE CULTURE  ABO/RH  GC/CHLAMYDIA PROBE AMP ([place]) NOT AT [facility]    EKG  None  Radiology US OB Comp < 14 Wks  Result Date: 04/26/2021 CLINICAL DATA:  Pregnant with vaginal bleeding EXAM: OBSTETRIC <14 WK ULTRASOUND TECHNIQUE: Transabdominal ultrasound was performed for evaluation of the gestation as well as the maternal uterus and adnexal regions. COMPARISON:  None. FINDINGS: Intrauterine gestational sac: Present Yolk sac:  Absent Embryo:  Absent Cardiac Activity: Absent MSD:  19 mm   6 w   6 d Subchorionic hemorrhage: Suspect trace subchorionic hemorrhage superiorly including on image 58 of the initial cine loop. Maternal uterus/adnexae: Right ovary not visualized. Left ovarian corpus luteal cyst of 2.3 x 2.5 x 1.8 cm. No significant free fluid. IMPRESSION: 1. Intrauterine gestational sac of 19 mm, corresponding to 6 weeks 6 days. Lack of yolk sac or fetal pole, likely due to early gestational age. 2. Trace subchorionic hemorrhage suspected. 3. Left ovarian corpus luteal cyst. Electronically Signed   By: Jeronimo Greaves M.D.   On: 04/26/2021 15:05    Procedures Procedures   Medications Ordered in ED Medications - No data to display  ED Course  I have reviewed the triage vital signs and the nursing notes.  Pertinent labs & imaging results that were available during my care of the patient were reviewed by me and considered in my medical decision making (see chart for details).    MDM Rules/Calculators/A&P                          20 year old female presenting to the emergency department today for evaluation of vaginal bleeding in pregnancy.  Her last menstrual  period was June/2022.  She has some mild pelvic pain as well.  hCG i-STAT is greater than 2000.  She does have some pyuria noted on her urinalysis, will send culture and treat empirically.  She has a very mild anemia on her Chem-8.  Rh status is positive therefore she will not require rhogham.  Wet prep does not show any evidence of yeast, trichomoniasis or BV.  Feel she is low risk for STDs at this time.  Have advised that she follow-up with OB/GYN and start taking prenatal vitamins.  She is in agreement to do so.  Have advised that she return to the ED for any new or worsening symptoms.  She voiced understanding the plan and reasons return.  Questions answered.  Patient stable for discharge.   Final Clinical Impression(s) / ED Diagnoses Final diagnoses:  Vaginal bleeding in pregnancy  Subchorionic hemorrhage of placenta, antepartum    Rx / DC Orders ED Discharge Orders          Ordered    cephALEXin (KEFLEX) 500 MG capsule  4 times daily        04/26/21 [address], [place], PA-C 04/26/21 1859    Linwood Dibbles, MD 04/29/21 1342

## 2021-04-27 ENCOUNTER — Telehealth: Payer: Self-pay

## 2021-04-27 LAB — GC/CHLAMYDIA PROBE AMP (~~LOC~~) NOT AT ARMC
Chlamydia: NEGATIVE
Comment: NEGATIVE
Comment: NORMAL
Neisseria Gonorrhea: NEGATIVE

## 2021-04-27 NOTE — Telephone Encounter (Signed)
Transition Care Management Unsuccessful Follow-up Telephone Call  Date of discharge and from where:  04/26/2021-[facility]  Attempts:  1st Attempt  Reason for unsuccessful TCM follow-up call:  Left voice message

## 2021-04-28 LAB — URINE CULTURE: Culture: <10000 — AB

## 2021-04-30 NOTE — Telephone Encounter (Signed)
Transition Care Management Follow-up Telephone Call Date of discharge and from where: 04/26/2021-[facility]  How have you been since you were released from the hospital? Patient stated she is feeling better.  Any questions or concerns? No  Items Reviewed: Did the pt receive and understand the discharge instructions provided? Yes  Medications obtained and verified? Yes  Other? No  Any new allergies since your discharge? No  Dietary orders reviewed? N/A Do you have support at home? Yes   Home Care and Equipment/Supplies: Were home health services ordered? not applicable If so, what is the name of the agency? N/A  Has the agency set up a time to come to the patient's home? not applicable Were any new equipment or medical supplies ordered?  No What is the name of the medical supply agency? N/A Were you able to get the supplies/equipment? not applicable Do you have any questions related to the use of the equipment or supplies? No  Functional Questionnaire: (I = Independent and D = Dependent) ADLs: I  Bathing/Dressing- I  Meal Prep- I  Eating- I  Maintaining continence- I  Transferring/Ambulation- I  Managing Meds- I  Follow up appointments reviewed:  PCP Hospital f/u appt confirmed? No   Specialist Hospital f/u appt confirmed? Yes  Scheduled to see Dr. Vergie Living on 05/25/2021 @ 8:55 am. Are transportation arrangements needed? No  If their condition worsens, is the pt aware to call PCP or go to the Emergency Dept.? Yes Was the patient provided with contact information for the PCP's office or ED? Yes Was to pt encouraged to call back with questions or concerns? Yes

## 2021-05-25 ENCOUNTER — Other Ambulatory Visit (HOSPITAL_COMMUNITY)
Admission: RE | Admit: 2021-05-25 | Discharge: 2021-05-25 | Disposition: A | Discharge status: Home / Self Care | Payer: Medicaid Other | Source: Ambulatory Visit | Attending: Obstetrics and Gynecology | Admitting: Obstetrics and Gynecology

## 2021-05-25 ENCOUNTER — Other Ambulatory Visit: Payer: Self-pay

## 2021-05-25 ENCOUNTER — Encounter: Payer: Self-pay | Admitting: Obstetrics and Gynecology

## 2021-05-25 ENCOUNTER — Ambulatory Visit (INDEPENDENT_AMBULATORY_CARE_PROVIDER_SITE_OTHER): Payer: Medicaid Other | Admitting: Obstetrics and Gynecology

## 2021-05-25 VITALS — BP 107/69 | HR 82 | Wt 237.1 lb

## 2021-05-25 DIAGNOSIS — Z3480 Encounter for supervision of other normal pregnancy, unspecified trimester: Secondary | ICD-10-CM | POA: Insufficient documentation

## 2021-05-25 DIAGNOSIS — O99210 Obesity complicating pregnancy, unspecified trimester: Secondary | ICD-10-CM

## 2021-05-25 DIAGNOSIS — Z136 Encounter for screening for cardiovascular disorders: Secondary | ICD-10-CM

## 2021-05-25 DIAGNOSIS — Z3A11 11 weeks gestation of pregnancy: Secondary | ICD-10-CM

## 2021-05-25 DIAGNOSIS — Z31430 Encounter of female for testing for genetic disease carrier status for procreative management: Secondary | ICD-10-CM | POA: Diagnosis not present

## 2021-05-25 DIAGNOSIS — Z6841 Body Mass Index (BMI) 40.0 and over, adult: Secondary | ICD-10-CM

## 2021-05-25 MED ORDER — BLOOD PRESSURE MONITORING DEVI: 1.0000 | 0 refills | Status: DC

## 2021-05-25 NOTE — Addendum Note (Signed)
Addended by: Henrietta Dine on: 05/25/2021 10:26 AM   Modules accepted: Orders

## 2021-05-25 NOTE — Progress Notes (Signed)
New OB Note  05/25/2021   Clinic: [facility]  Chief Complaint: new ob  Transfer of Care Patient: no  History of Present Illness: Ms. Paula Noble is a 20 y.o. G1P0 @ 11/0 weeks (EDC 3/31, based on Patient's last menstrual period was 12/27/2020.=6wk u/s  Preg complicated by has CHILDHOOD OBESITY; HEARING LOSS, LEFT EAR; Hyperlipidemia; Insulin resistance; Primary focal hyperhidrosis; Birth control counseling; Numbness and tingling; Microcytic anemia; and Supervision of other normal pregnancy, antepartum on their problem list.   Any events prior to today's visit: patient went to ED vb; none since She was using no method when she conceived.  She has Negative signs or symptoms of nausea/vomiting of pregnancy. She has Negative signs or symptoms of miscarriage or preterm labor  ROS: A 12-point review of systems was performed and negative, except as stated in the above HPI.  OBGYN History: As per HPI. OB History  Gravida Para Term Preterm AB Living  1            SAB IAB Ectopic Multiple Live Births               # Outcome Date GA Lbr Len/2nd Weight Sex Delivery Anes PTL Lv  1 Current             Any issues with any prior pregnancies: not applicable Prior children are healthy, doing well, and without any problems or issues: not applicable History of pap smears: No.    Past Medical History: Past Medical History:  Diagnosis Date  . Microcytic anemia 09/12/2018    Past Surgical History: Past Surgical History:  Procedure Laterality Date  . NO PAST SURGERIES      Family History:  Family History  Problem Relation Age of Onset  . Diabetes Mother   . Healthy Father    She denies any history of mental retardation, birth defects or genetic disorders in her or the FOB's history  Social History:  Social History   Socioeconomic History  . Marital status: Single    Spouse name: Not on file  . Number of children: Not on file  . Years of  education: Not on file  . Highest education level: Not on file  Occupational History  . Not on file  Tobacco Use  . Smoking status: Former  . Smokeless tobacco: Never  . Tobacco comments:    pt states it has been months  Vaping Use  . Vaping Use: Every day  . Substances: Nicotine, Flavoring  Substance and Sexual Activity  . Alcohol use: Not Currently  . Drug use: Not Currently    Types: Marijuana  . Sexual activity: Yes    Birth control/protection: None  Other Topics Concern  . Not on file  Social History Narrative  . Not on file   Social Determinants of Health   Financial Resource Strain: Not on file  Food Insecurity: Not on file  Transportation Needs: Not on file  Physical Activity: Not on file  Stress: Not on file  Social Connections: Not on file  Intimate Partner Violence: Not on file     Allergy: No Known Allergies  Current Outpatient Medications: Prenatal vitamin  Physical Exam:   BP 107/69   Pulse 82   Wt 237 lb 1.6 oz (107.5 kg)   LMP 12/27/2020 Comment: neg preg test  BMI 42.00 kg/m  Body mass index is 42 kg/m. Contractions: Not present Vag. Bleeding: None. Fundal height: not applicable FHTs: 160s  General appearance: Well nourished,  well developed female in no acute distress.  Neck:  Supple, normal appearance, and no thyromegaly  Cardiovascular: S1, S2 normal, no murmur, rub or gallop, regular rate and rhythm Respiratory:  Clear to auscultation bilateral. Normal respiratory effort Abdomen: positive bowel sounds and no masses, hernias; diffusely non tender to palpation, non distended Breasts: pt denies any s/s.  Neuro/Psych:  Normal mood and affect.  Skin:  Warm and dry.   Laboratory: reviewed  Imaging:  Narrative & Impression  CLINICAL DATA:  Pregnant with vaginal bleeding   EXAM: OBSTETRIC <14 WK ULTRASOUND   TECHNIQUE: Transabdominal ultrasound was performed for evaluation of the gestation as well as the maternal uterus and  adnexal regions.   COMPARISON:  None.   FINDINGS: Intrauterine gestational sac: Present   Yolk sac:  Absent   Embryo:  Absent   Cardiac Activity: Absent   MSD:  19 mm   6 w   6 d   Subchorionic hemorrhage: Suspect trace subchorionic hemorrhage superiorly including on image 58 of the initial cine loop.   Maternal uterus/adnexae: Right ovary not visualized. Left ovarian corpus luteal cyst of 2.3 x 2.5 x 1.8 cm.   No significant free fluid.   IMPRESSION: 1. Intrauterine gestational sac of 19 mm, corresponding to 6 weeks 6 days. Lack of yolk sac or fetal pole, likely due to early gestational age. 2. Trace subchorionic hemorrhage suspected. 3. Left ovarian corpus luteal cyst.     Electronically Signed   By: Jeronimo Greaves M.D.   On: 04/26/2021 15:05    Assessment: pt stable  Plan: 1. Supervision of other normal pregnancy, antepartum Confirm dates after anatomy u/s Offer afp nv Anatomy u/s to be scheduled today - CHL AMB BABYSCRIPTS SCHEDULE OPTIMIZATION - Culture, OB Urine - CBC/D/Plt+RPR+Rh+ABO+RubIgG... - Genetic Screening - US MFM OB COMP + 14 WK; Future - Hemoglobin A1c - Cervicovaginal ancillary only( [place]) - Comprehensive metabolic panel - Protein / creatinine ratio, urine - TSH  2. Obesity in pregnancy TWG goals d/w her  3. BMI 40.0-44.9, adult (HCC)  4. [redacted] weeks gestation of pregnancy  Problem list reviewed and updated.  Follow up in 4 weeks.   >50% of 30 min visit spent on counseling and coordination of care.     Cornelia Copa MD Attending Center for [facility])

## 2021-05-26 LAB — CBC/D/PLT+RPR+RH+ABO+RUBIGG...
Antibody Screen: NEGATIVE
Basophils Absolute: 0 10*3/uL (ref 0.0–0.2)
Basos: 0 %
EOS (ABSOLUTE): 0.1 10*3/uL (ref 0.0–0.4)
Eos: 1 %
HCV Ab: <0.1 s/co ratio (ref 0.0–0.9)
HIV Screen 4th Generation wRfx: NONREACTIVE
Hematocrit: 34.8 % (ref 34.0–46.6)
Hemoglobin: 11.3 g/dL (ref 11.1–15.9)
Hepatitis B Surface Ag: NEGATIVE
Immature Grans (Abs): 0 10*3/uL (ref 0.0–0.1)
Immature Granulocytes: 0 %
Lymphocytes Absolute: 1.4 10*3/uL (ref 0.7–3.1)
Lymphs: 19 %
MCH: 25.1 pg — ABNORMAL LOW (ref 26.6–33.0)
MCHC: 32.5 g/dL (ref 31.5–35.7)
MCV: 77 fL — ABNORMAL LOW (ref 79–97)
Monocytes Absolute: 0.3 10*3/uL (ref 0.1–0.9)
Monocytes: 4 %
Neutrophils Absolute: 5.8 10*3/uL (ref 1.4–7.0)
Neutrophils: 76 %
Platelets: 211 10*3/uL (ref 150–450)
RBC: 4.5 x10E6/uL (ref 3.77–5.28)
RDW: 17.9 % — ABNORMAL HIGH (ref 11.7–15.4)
RPR Ser Ql: NONREACTIVE
Rh Factor: POSITIVE
Rubella Antibodies, IGG: <0.9 index — ABNORMAL LOW (ref >0.99)
WBC: 7.6 10*3/uL (ref 3.4–10.8)

## 2021-05-26 LAB — HEMOGLOBIN A1C
Est. average glucose Bld gHb Est-mCnc: 111 mg/dL
Hgb A1c MFr Bld: 5.5 % (ref 4.8–5.6)

## 2021-05-26 LAB — COMPREHENSIVE METABOLIC PANEL
ALT: 8 IU/L (ref 0–32)
AST: 9 IU/L (ref 0–40)
Albumin/Globulin Ratio: 1.8 (ref 1.2–2.2)
Albumin: 4.2 g/dL (ref 3.9–5.0)
Alkaline Phosphatase: 42 IU/L (ref 42–106)
BUN/Creatinine Ratio: 24 — ABNORMAL HIGH (ref 9–23)
BUN: 8 mg/dL (ref 6–20)
Bilirubin Total: <0.2 mg/dL (ref 0.0–1.2)
CO2: 18 mmol/L — ABNORMAL LOW (ref 20–29)
Calcium: 9.4 mg/dL (ref 8.7–10.2)
Chloride: 102 mmol/L (ref 96–106)
Creatinine, Ser: 0.34 mg/dL — ABNORMAL LOW (ref 0.57–1.00)
Globulin, Total: 2.4 g/dL (ref 1.5–4.5)
Glucose: 84 mg/dL (ref 65–99)
Potassium: 4.1 mmol/L (ref 3.5–5.2)
Sodium: 136 mmol/L (ref 134–144)
Total Protein: 6.6 g/dL (ref 6.0–8.5)
eGFR: 151 mL/min/{1.73_m2} (ref >59)

## 2021-05-26 LAB — PROTEIN / CREATININE RATIO, URINE
Creatinine, Urine: 167.5 mg/dL
Protein, Ur: 13.3 mg/dL
Protein/Creat Ratio: 79 mg/g creat (ref 0–200)

## 2021-05-26 LAB — TSH: TSH: 1.82 u[IU]/mL (ref 0.450–4.500)

## 2021-05-26 LAB — HCV INTERPRETATION

## 2021-05-27 ENCOUNTER — Encounter: Payer: Self-pay | Admitting: Obstetrics and Gynecology

## 2021-05-27 DIAGNOSIS — O09899 Supervision of other high risk pregnancies, unspecified trimester: Secondary | ICD-10-CM | POA: Insufficient documentation

## 2021-05-27 DIAGNOSIS — Z2839 Other underimmunization status: Secondary | ICD-10-CM | POA: Insufficient documentation

## 2021-05-27 LAB — CULTURE, OB URINE

## 2021-05-27 LAB — URINE CULTURE, OB REFLEX

## 2021-05-28 LAB — CERVICOVAGINAL ANCILLARY ONLY
Chlamydia: NEGATIVE
Comment: NEGATIVE
Comment: NORMAL
Neisseria Gonorrhea: NEGATIVE

## 2021-06-04 ENCOUNTER — Encounter: Payer: Self-pay | Admitting: General Practice

## 2021-06-07 ENCOUNTER — Other Ambulatory Visit: Payer: Self-pay

## 2021-06-07 ENCOUNTER — Ambulatory Visit (INDEPENDENT_AMBULATORY_CARE_PROVIDER_SITE_OTHER): Payer: Medicaid Other | Admitting: Pharmacist

## 2021-06-07 VITALS — BP 106/71 | HR 85 | Ht 63.0 in | Wt 238.7 lb

## 2021-06-07 DIAGNOSIS — Z3480 Encounter for supervision of other normal pregnancy, unspecified trimester: Secondary | ICD-10-CM | POA: Diagnosis not present

## 2021-06-07 DIAGNOSIS — Z0130 Encounter for examination of blood pressure without abnormal findings: Secondary | ICD-10-CM

## 2021-06-07 NOTE — Progress Notes (Signed)
Patient presenting today for blood pressure after calling this morning with a persistent headache since yesterday that does not improve with acetaminophen use. She states she is hydrating well and has no other complaints. Patient is notable to be anxious during this visit.  Blood pressure today is 106/71 mmHg. She is going to pick up home blood pressure cuff immediately after leaving this appointment.   Counseled patient to continue monitoring symptoms and utilize BP cuff to monitor BP at home. Will follow-up in 3 weeks at routine OB appointment.  Discussed patient with Dr. Alysia Penna who is in agreement with plan.  [place] Ponder, PharmD, BCPPS

## 2021-06-19 ENCOUNTER — Telehealth: Payer: Self-pay | Admitting: Lactation Services

## 2021-06-19 NOTE — Telephone Encounter (Signed)
Called patient at request of Prenatal Navigator, Renea Ee.   Patient reports she is not sure if she wants to BF or start with formula. She is concerned with failing with BF.  She is leaning more towards breast feeding at this time.   Reviewed benefits of breast feeding for mom and infant. Reviewed [email or web address] has BF classes and childbirth classes available to take during pregnancy. Reviewed IP/OP Lactation services available. Reviewed nurses in OB office will cover some BF education during her visits for [facility].  Patient has signed up for [facility], reviewed that [facility] has breast feeding programs to assist patients also.   Reviewed that Medicaid does provide breast pumps for patients and she can work with the office to get an order for that.   Patient with no other questions or concerns at this time. She will call office or ask when in for a visit for further questions or concerns.

## 2021-06-28 ENCOUNTER — Ambulatory Visit (INDEPENDENT_AMBULATORY_CARE_PROVIDER_SITE_OTHER): Payer: Medicaid Other | Admitting: Certified Nurse Midwife

## 2021-06-28 ENCOUNTER — Other Ambulatory Visit: Payer: Self-pay

## 2021-06-28 VITALS — BP 125/73 | HR 74 | Wt 238.0 lb

## 2021-06-28 DIAGNOSIS — Z3480 Encounter for supervision of other normal pregnancy, unspecified trimester: Secondary | ICD-10-CM | POA: Diagnosis not present

## 2021-06-28 DIAGNOSIS — O26892 Other specified pregnancy related conditions, second trimester: Secondary | ICD-10-CM

## 2021-06-28 DIAGNOSIS — R4589 Other symptoms and signs involving emotional state: Secondary | ICD-10-CM

## 2021-06-28 DIAGNOSIS — Z23 Encounter for immunization: Secondary | ICD-10-CM | POA: Diagnosis not present

## 2021-06-28 DIAGNOSIS — Z3A15 15 weeks gestation of pregnancy: Secondary | ICD-10-CM

## 2021-06-28 DIAGNOSIS — R519 Headache, unspecified: Secondary | ICD-10-CM

## 2021-06-28 MED ORDER — MAGNESIUM OXIDE -MG SUPPLEMENT 200 MG PO TABS: 400.0000 mg | ORAL_TABLET | Freq: Every day | ORAL | 3 refills | Status: DC

## 2021-06-29 NOTE — Progress Notes (Signed)
   PRENATAL VISIT NOTE  Subjective:  Paula Noble is a 20 y.o. G1P0 at [redacted]w[redacted]d being seen today for ongoing prenatal care.  She is currently monitored for the following issues for this low-risk pregnancy and has CHILDHOOD OBESITY; HEARING LOSS, LEFT EAR; Hyperlipidemia; Insulin resistance; Primary focal hyperhidrosis; Birth control counseling; Numbness and tingling; Microcytic anemia; Supervision of other normal pregnancy, antepartum; and Rubella non-immune status, antepartum on their problem list.  Patient reports headache and feeling anxious about the pregnancy .  Contractions: Not present. Vag. Bleeding: None.  Movement: Present. Denies leaking of fluid.   The following portions of the patient's history were reviewed and updated as appropriate: allergies, current medications, past family history, past medical history, past social history, past surgical history and problem list.   Objective:   Vitals:   06/28/21 1031  BP: 125/73  Pulse: 74  Weight: 238 lb (108 kg)    Fetal Status: Fetal Heart Rate (bpm): 156   Movement: Present     General:  Alert, oriented and cooperative. Patient is in no acute distress.  Skin: Skin is warm and dry. No rash noted.   Cardiovascular: Normal heart rate noted  Respiratory: Normal respiratory effort, no problems with respiration noted  Abdomen: Soft, gravid, appropriate for gestational age.  Pain/Pressure: Absent     Pelvic: Cervical exam deferred        Extremities: Normal range of motion.  Edema: None  Mental Status: Normal mood and affect. Normal behavior. Normal judgment and thought content.  Pt informed that the ultrasound is considered a limited OB ultrasound and is not intended to be a complete ultrasound exam.  Patient also informed that the ultrasound is not being completed with the intent of assessing for fetal or placental anomalies or any pelvic abnormalities.  Explained that the purpose of today's ultrasound is to assess for    viability/movement - to assuage pt anxiety .  Patient acknowledges the purpose of the exam and the limitations of the study.  Baby easily found on U/S, pt advised to take video of fetal movement and heart beat for future review.  Assessment and Plan:  Pregnancy: G1P0 at [redacted]w[redacted]d 1. Supervision of other normal pregnancy, antepartum - Doing well, starting to feel fetal movement  - Flu Vaccine QUAD 104mo+IM (Fluarix, Fluzone & Alfiuria Quad PF)  2. [redacted] weeks gestation of pregnancy - Routine OB care  - AFP, Serum, Open Spina Bifida  3. Feeling anxious - Expressed relief after bedside U/S  4. Pregnancy headache in second trimester - only last 2 hrs or so but Tylenol does not work, just takes a nap - Magnesium Oxide (MAG-OXIDE) 200 MG TABS; Take 2 tablets (400 mg total) by mouth at bedtime. If that amount causes loose stools in the am, switch to 200mg  daily at bedtime.  Dispense: 60 tablet; Refill: 3  Preterm labor symptoms and general obstetric precautions including but not limited to vaginal bleeding, contractions, leaking of fluid and fetal movement were reviewed in detail with the patient. Please refer to After Visit Summary for other counseling recommendations.   Return in about 4 weeks (around 07/26/2021) for IN-PERSON, LOB.  Future Appointments  Date Time Provider Department Center  07/20/2021  9:45 AM [facility] US4 [facility]  07/26/2021 10:55 AM 13/06/2021, MD [facility]    [facility] CNM

## 2021-06-30 LAB — AFP, SERUM, OPEN SPINA BIFIDA
AFP MoM: 0.58
AFP Value: 14.5 ng/mL
Gest. Age on Collection Date: 15.6 weeks
Maternal Age At EDD: 21 yr
OSBR Risk 1 IN: 10000
Test Results:: NEGATIVE
Weight: 239 [lb_av]

## 2021-07-20 ENCOUNTER — Ambulatory Visit: Payer: Medicaid Other | Attending: Obstetrics and Gynecology

## 2021-07-20 ENCOUNTER — Other Ambulatory Visit: Payer: Self-pay | Admitting: Obstetrics and Gynecology

## 2021-07-20 ENCOUNTER — Other Ambulatory Visit: Payer: Self-pay

## 2021-07-20 ENCOUNTER — Ambulatory Visit: Payer: Medicaid Other | Attending: Maternal & Fetal Medicine | Admitting: Maternal & Fetal Medicine

## 2021-07-20 ENCOUNTER — Other Ambulatory Visit: Payer: Self-pay | Admitting: *Deleted

## 2021-07-20 DIAGNOSIS — Z3480 Encounter for supervision of other normal pregnancy, unspecified trimester: Secondary | ICD-10-CM

## 2021-07-20 DIAGNOSIS — Z6841 Body Mass Index (BMI) 40.0 and over, adult: Secondary | ICD-10-CM

## 2021-07-20 DIAGNOSIS — O35BXX0 Maternal care for other (suspected) fetal abnormality and damage, fetal cardiac anomalies, not applicable or unspecified: Secondary | ICD-10-CM | POA: Diagnosis not present

## 2021-07-20 DIAGNOSIS — O99332 Smoking (tobacco) complicating pregnancy, second trimester: Secondary | ICD-10-CM

## 2021-07-20 DIAGNOSIS — Z862 Personal history of diseases of the blood and blood-forming organs and certain disorders involving the immune mechanism: Secondary | ICD-10-CM

## 2021-07-20 DIAGNOSIS — O26842 Uterine size-date discrepancy, second trimester: Secondary | ICD-10-CM

## 2021-07-21 NOTE — Progress Notes (Signed)
MFM Brief Note   Ms. Paula Noble is a 20 yo G1P0 with an EDD of 12/17/21 by a 6 w 6 d ultrasound. She is here for a detailed ultrasound at the request of [place] Bing, MD due to an elevated BMI.    Regarding today's ultrasound, a single intrauterine pregnancy was observed with measurements consistent with dates. 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 In addition no additional markers of aneuploidy were observed.  There was good fetal movement and amniotic fluid volume.     Today we observed a ventricular septal defect vs tetralogy of fallot. In some images we observed an overriding aorta over the VSD. This finding was not consistent through out the study. 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 However, there was consistent color flow crossing the perimembranous portion of the right and left ventricle. This was seen the apical as well subcostal views. The defect measured 3-5 mm.   The most common forms of VSD (muscular and membranous) are known to close spontaneously, either prenatally or postnatally. The finding of even the smallest muscular VSD appears to increase the risk for additional cardiac abnormalities and aneuploidy.  Small or moderate muscular defects may close prenatally or within a few years after delivery.    We discussed that given todays findings diagnostic genetic testing is recommended. We discussed that at this time the finding appears isolated. 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   We discussed the option of diagnostic amniotiocentesis including the risk and benefits in comparison to the cell free DNA. At this time [name]  declined additional testing, however, they opted to have a pediactric echocardiogram performed and will strongly consider an amniocentesis and genetic counseling if today's findings are confirmed by the fetal echocardiogram.   A referral to  [facility] was made.   Ms. Paula Noble is scheduled to return in 4 weeks or after the fetal echocardiogram.  I spent 30 minutes with > 50% in face to face consultation.   All questions answered.   Novella Olive, MD

## 2021-07-23 ENCOUNTER — Encounter: Payer: Self-pay | Admitting: Obstetrics and Gynecology

## 2021-07-23 DIAGNOSIS — O283 Abnormal ultrasonic finding on antenatal screening of mother: Secondary | ICD-10-CM

## 2021-07-23 HISTORY — DX: Abnormal ultrasonic finding on antenatal screening of mother: O28.3

## 2021-07-26 ENCOUNTER — Ambulatory Visit (INDEPENDENT_AMBULATORY_CARE_PROVIDER_SITE_OTHER): Payer: Medicaid Other | Admitting: Obstetrics & Gynecology

## 2021-07-26 ENCOUNTER — Other Ambulatory Visit: Payer: Self-pay

## 2021-07-26 VITALS — BP 105/68 | HR 87 | Wt 239.8 lb

## 2021-07-26 DIAGNOSIS — Z3480 Encounter for supervision of other normal pregnancy, unspecified trimester: Secondary | ICD-10-CM | POA: Diagnosis not present

## 2021-07-26 DIAGNOSIS — O283 Abnormal ultrasonic finding on antenatal screening of mother: Secondary | ICD-10-CM

## 2021-07-26 NOTE — Progress Notes (Signed)
Home Medicaid Form completed  Achille Xiang,RN  

## 2021-07-26 NOTE — Progress Notes (Signed)
   PRENATAL VISIT NOTE  Subjective:  Paula Noble is a 20 y.o. G1P0 at [redacted]w[redacted]d being seen today for ongoing prenatal care.  She is currently monitored for the following issues for this high-risk pregnancy and has CHILDHOOD OBESITY; HEARING LOSS, LEFT EAR; Hyperlipidemia; Insulin resistance; Primary focal hyperhidrosis; Numbness and tingling; Microcytic anemia; Supervision of other normal pregnancy, antepartum; Rubella non-immune status, antepartum; and ? fetal VSD vs tetraology of fallot on their problem list.  Patient reports no complaints.  Contractions: Not present. Vag. Bleeding: None.  Movement: Present. Denies leaking of fluid.   The following portions of the patient's history were reviewed and updated as appropriate: allergies, current medications, past family history, past medical history, past social history, past surgical history and problem list.   Objective:   Vitals:   07/26/21 1111  BP: 105/68  Pulse: 87  Weight: 239 lb 12.8 oz (108.8 kg)    Fetal Status: Fetal Heart Rate (bpm): 150   Movement: Present     General:  Alert, oriented and cooperative. Patient is in no acute distress.  Skin: Skin is warm and dry. No rash noted.   Cardiovascular: Normal heart rate noted  Respiratory: Normal respiratory effort, no problems with respiration noted  Abdomen: Soft, gravid, appropriate for gestational age.  Pain/Pressure: Absent     Pelvic: Cervical exam deferred        Extremities: Normal range of motion.  Edema: None  Mental Status: Normal mood and affect. Normal behavior. Normal judgment and thought content.   Assessment and Plan:  Pregnancy: G1P0 at [redacted]w[redacted]d 1. ? fetal VSD vs tetraology of fallot Needs f/u with Peds Cardiology  2. Supervision of other normal pregnancy, antepartum [redacted]w[redacted]d   Preterm labor symptoms and general obstetric precautions including but not limited to vaginal bleeding, contractions, leaking of fluid and fetal movement were reviewed in detail with  the patient. Please refer to After Visit Summary for other counseling recommendations.   Return in about 4 weeks (around 08/23/2021).  Future Appointments  Date Time Provider Department Center  08/23/2021  8:15 AM Osborne Oman [facility]  08/31/2021  8:30 AM [facility] NURSE [facility]  08/31/2021  8:45 AM [facility] US4 [facility]    Scheryl Darter, MD

## 2021-07-27 ENCOUNTER — Encounter: Payer: Self-pay | Admitting: *Deleted

## 2021-08-02 ENCOUNTER — Telehealth: Payer: Self-pay

## 2021-08-02 NOTE — Telephone Encounter (Signed)
Called to notify of scheduled appointment at [facility] CARDIOLOGY. PT appointment is schedule for 08/13/2021 at 12:00pm,

## 2021-08-13 DIAGNOSIS — Q250 Patent ductus arteriosus: Secondary | ICD-10-CM | POA: Diagnosis not present

## 2021-08-13 DIAGNOSIS — Q2112 Patent foramen ovale: Secondary | ICD-10-CM | POA: Diagnosis not present

## 2021-08-20 ENCOUNTER — Encounter: Payer: Self-pay | Admitting: Certified Nurse Midwife

## 2021-08-21 ENCOUNTER — Ambulatory Visit: Payer: Medicaid Other | Admitting: Clinical

## 2021-08-21 DIAGNOSIS — Z91199 Patient's noncompliance with other medical treatment and regimen due to unspecified reason: Secondary | ICD-10-CM

## 2021-08-21 NOTE — BH Specialist Note (Signed)
Pt did not arrive to video visit and did not answer the phone; Left HIPPA-compliant message to call back Alazay Leicht from [facility] at  [phone] (Farrell Broerman's office).  ?; left MyChart message for patient.  ? ?

## 2021-08-23 ENCOUNTER — Other Ambulatory Visit: Payer: Self-pay

## 2021-08-23 ENCOUNTER — Ambulatory Visit (INDEPENDENT_AMBULATORY_CARE_PROVIDER_SITE_OTHER): Payer: Medicaid Other | Admitting: Certified Nurse Midwife

## 2021-08-23 VITALS — BP 104/60 | HR 86 | Wt 237.7 lb

## 2021-08-23 DIAGNOSIS — Z3402 Encounter for supervision of normal first pregnancy, second trimester: Secondary | ICD-10-CM

## 2021-08-23 DIAGNOSIS — Z3480 Encounter for supervision of other normal pregnancy, unspecified trimester: Secondary | ICD-10-CM

## 2021-08-23 DIAGNOSIS — O283 Abnormal ultrasonic finding on antenatal screening of mother: Secondary | ICD-10-CM

## 2021-08-23 DIAGNOSIS — Z3A23 23 weeks gestation of pregnancy: Secondary | ICD-10-CM

## 2021-08-26 ENCOUNTER — Encounter: Payer: Self-pay | Admitting: Certified Nurse Midwife

## 2021-08-26 NOTE — Progress Notes (Signed)
   PRENATAL VISIT NOTE  Subjective:  Paula Noble is a 20 y.o. G1P0 at [redacted]w[redacted]d being seen today for ongoing prenatal care.  She is currently monitored for the following issues for this low-risk pregnancy and has Supervision of other normal pregnancy, antepartum and Rubella non-immune status, antepartum on their problem list.  Patient reports no complaints.  Contractions: Not present. Vag. Bleeding: None.  Movement: Present. Denies leaking of fluid.   The following portions of the patient's history were reviewed and updated as appropriate: allergies, current medications, past family history, past medical history, past social history, past surgical history and problem list.   Objective:   Vitals:   08/23/21 0824  BP: 104/60  Pulse: 86  Weight: 237 lb 11.2 oz (107.8 kg)    Fetal Status: Fetal Heart Rate (bpm): 152 Fundal Height: 23 cm Movement: Present     General:  Alert, oriented and cooperative. Patient is in no acute distress.  Skin: Skin is warm and dry. No rash noted.   Cardiovascular: Normal heart rate noted  Respiratory: Normal respiratory effort, no problems with respiration noted  Abdomen: Soft, gravid, appropriate for gestational age.  Pain/Pressure: Present     Pelvic: Cervical exam deferred        Extremities: Normal range of motion.  Edema: None  Mental Status: Normal mood and affect. Normal behavior. Normal judgment and thought content.   Assessment and Plan:  Pregnancy: G1P0 at [redacted]w[redacted]d 1. Encounter for supervision of low-risk first pregnancy in second trimester - Doing well, feeling regular and vigorous fetal movement   2. [redacted] weeks gestation of pregnancy - Routine OB care including anticipatory guidance re GTT at next visit and need to be fasting.  3. ? fetal VSD vs tetraology of fallot - Had a normal fetal echo at [facility] on 08/13/21. Reviewed report in Care Everywhere.  Preterm labor symptoms and general obstetric precautions including but not limited to  vaginal bleeding, contractions, leaking of fluid and fetal movement were reviewed in detail with the patient. Please refer to After Visit Summary for other counseling recommendations.   Return in about 3 weeks (around 09/13/2021) for IN-PERSON, LOB/GTT.  Future Appointments  Date Time Provider Department Center  08/31/2021  8:30 AM [facility]URSE [facility]  08/31/2021  8:45 AM [facility] US4 [facility]  09/12/2021  8:20 AM [facility]  09/12/2021  8:55 AM Dan Humphreys, Judye Bos, CNM [facility]    Bernerd Limbo, CNM

## 2021-08-31 ENCOUNTER — Ambulatory Visit: Payer: Medicaid Other | Attending: Maternal & Fetal Medicine

## 2021-08-31 ENCOUNTER — Other Ambulatory Visit: Payer: Self-pay | Admitting: *Deleted

## 2021-08-31 ENCOUNTER — Other Ambulatory Visit: Payer: Self-pay

## 2021-08-31 ENCOUNTER — Ambulatory Visit: Payer: Medicaid Other | Admitting: *Deleted

## 2021-08-31 DIAGNOSIS — O26899 Other specified pregnancy related conditions, unspecified trimester: Secondary | ICD-10-CM

## 2021-08-31 DIAGNOSIS — O99212 Obesity complicating pregnancy, second trimester: Secondary | ICD-10-CM

## 2021-08-31 DIAGNOSIS — Z3A24 24 weeks gestation of pregnancy: Secondary | ICD-10-CM

## 2021-08-31 DIAGNOSIS — D509 Iron deficiency anemia, unspecified: Secondary | ICD-10-CM | POA: Diagnosis not present

## 2021-08-31 DIAGNOSIS — O99332 Smoking (tobacco) complicating pregnancy, second trimester: Secondary | ICD-10-CM | POA: Insufficient documentation

## 2021-08-31 DIAGNOSIS — Z862 Personal history of diseases of the blood and blood-forming organs and certain disorders involving the immune mechanism: Secondary | ICD-10-CM | POA: Insufficient documentation

## 2021-08-31 DIAGNOSIS — E669 Obesity, unspecified: Secondary | ICD-10-CM | POA: Diagnosis not present

## 2021-08-31 DIAGNOSIS — O26842 Uterine size-date discrepancy, second trimester: Secondary | ICD-10-CM | POA: Insufficient documentation

## 2021-08-31 DIAGNOSIS — O99012 Anemia complicating pregnancy, second trimester: Secondary | ICD-10-CM | POA: Diagnosis not present

## 2021-08-31 DIAGNOSIS — O99282 Endocrine, nutritional and metabolic diseases complicating pregnancy, second trimester: Secondary | ICD-10-CM | POA: Diagnosis not present

## 2021-08-31 DIAGNOSIS — E785 Hyperlipidemia, unspecified: Secondary | ICD-10-CM | POA: Diagnosis not present

## 2021-08-31 DIAGNOSIS — Z6841 Body Mass Index (BMI) 40.0 and over, adult: Secondary | ICD-10-CM

## 2021-09-12 ENCOUNTER — Other Ambulatory Visit: Payer: Self-pay

## 2021-09-12 ENCOUNTER — Ambulatory Visit (INDEPENDENT_AMBULATORY_CARE_PROVIDER_SITE_OTHER): Payer: Medicaid Other | Admitting: Certified Nurse Midwife

## 2021-09-12 ENCOUNTER — Other Ambulatory Visit: Payer: Medicaid Other

## 2021-09-12 ENCOUNTER — Other Ambulatory Visit: Payer: Self-pay | Admitting: General Practice

## 2021-09-12 VITALS — BP 115/82 | HR 83 | Wt 237.0 lb

## 2021-09-12 DIAGNOSIS — Z3480 Encounter for supervision of other normal pregnancy, unspecified trimester: Secondary | ICD-10-CM

## 2021-09-12 DIAGNOSIS — Z23 Encounter for immunization: Secondary | ICD-10-CM | POA: Diagnosis not present

## 2021-09-12 DIAGNOSIS — Z3402 Encounter for supervision of normal first pregnancy, second trimester: Secondary | ICD-10-CM

## 2021-09-12 DIAGNOSIS — Z3A29 29 weeks gestation of pregnancy: Secondary | ICD-10-CM

## 2021-09-12 NOTE — Progress Notes (Signed)
° °  PRENATAL VISIT NOTE  Subjective:  Paula Noble is a 20 y.o. G1P0 at [redacted]w[redacted]d being seen today for ongoing prenatal care.  She is currently monitored for the following issues for this low-risk pregnancy and has Supervision of other normal pregnancy, antepartum and Rubella non-immune status, antepartum on their problem list.  Patient reports no complaints.  Contractions: Not present. Vag. Bleeding: None.  Movement: Present. Denies leaking of fluid.   The following portions of the patient's history were reviewed and updated as appropriate: allergies, current medications, past family history, past medical history, past social history, past surgical history and problem list.   Objective:   Vitals:   09/12/21 0856  BP: 115/82  Pulse: 83  Weight: 237 lb (107.5 kg)   Fetal Status: Fetal Heart Rate (bpm): 147 Fundal Height: 26 cm Movement: Present     General:  Alert, oriented and cooperative. Patient is in no acute distress.  Skin: Skin is warm and dry. No rash noted.   Cardiovascular: Normal heart rate noted  Respiratory: Normal respiratory effort, no problems with respiration noted  Abdomen: Soft, gravid, appropriate for gestational age.  Pain/Pressure: Absent     Pelvic: Cervical exam deferred        Extremities: Normal range of motion.  Edema: None  Mental Status: Normal mood and affect. Normal behavior. Normal judgment and thought content.   Assessment and Plan:  Pregnancy: G1P0 at [redacted]w[redacted]d 1. Supervision of low-risk first pregnancy, second trimester - Doing well, feeling regular and vigorous fetal movement  - Tdap vaccine greater than or equal to 7yo IM  2. [redacted] weeks gestation of pregnancy - Routine OB care including GTT and 3rd trimester labs  Preterm labor symptoms and general obstetric precautions including but not limited to vaginal bleeding, contractions, leaking of fluid and fetal movement were reviewed in detail with the patient. Please refer to After Visit Summary for  other counseling recommendations.   Return in about 2 weeks (around 09/26/2021) for IN-PERSON, LOB.  Future Appointments  Date Time Provider Department Center  09/26/2021 11:15 AM Milas Hock, MD [facility]  10/25/2021  8:30 AM [facility] NURSE [facility]  10/25/2021  8:45 AM [facility] US4 [facility]    Bernerd Limbo, CNM

## 2021-09-13 LAB — CBC
Hematocrit: 31.8 % — ABNORMAL LOW (ref 34.0–46.6)
Hemoglobin: 10.1 g/dL — ABNORMAL LOW (ref 11.1–15.9)
MCH: 26.4 pg — ABNORMAL LOW (ref 26.6–33.0)
MCHC: 31.8 g/dL (ref 31.5–35.7)
MCV: 83 fL (ref 79–97)
Platelets: 216 10*3/uL (ref 150–450)
RBC: 3.82 x10E6/uL (ref 3.77–5.28)
RDW: 14.5 % (ref 11.7–15.4)
WBC: 7.8 10*3/uL (ref 3.4–10.8)

## 2021-09-13 LAB — HIV ANTIBODY (ROUTINE TESTING W REFLEX): HIV Screen 4th Generation wRfx: NONREACTIVE

## 2021-09-13 LAB — GLUCOSE TOLERANCE, 2 HOURS W/ 1HR
Glucose, 1 hour: 134 mg/dL (ref 70–179)
Glucose, 2 hour: 101 mg/dL (ref 70–152)
Glucose, Fasting: 77 mg/dL (ref 70–91)

## 2021-09-13 LAB — RPR: RPR Ser Ql: NONREACTIVE

## 2021-09-16 NOTE — L&D Delivery Note (Addendum)
OB/GYN Faculty Practice Delivery Note ? ?Paula Noble is a 20 y.o. G1P0 s/p SVD at [redacted]w[redacted]d. She was admitted for IOL for BPP 2/8 and decreased fetal movement.  ? ?ROM: 4h 38m with clear fluid ?GBS Status: negative ?Maximum Maternal Temperature: 98.9 ? ?Labor Progress: ?Presented from MFM for BPP 2/8 and decreased fetal movement. Received cytotec x1 and then a foley balloon. Her foley balloon came out and she was started on pitocin, had an IUPC placed which showed adequate contractions, and progressed to complete. ? ?Delivery Date/Time: 0421 on 3/17 ?Delivery: Called to room and patient was complete and pushing. Trickle of blood noted from introitus around baby;s head. Head delivered OA. No nuchal cord present. Shoulder and body delivered in usual fashion. Infant with spontaneous cry, placed on mother's abdomen, dried and stimulated. Cord clamped x 2 after 1-minute delay, and cut by grandmother of baby under my direct supervision.  ? ?Immediately after cord was cut large gush of blood noted and upon inspection atleast portion of it coming from laceration. Cord blood drawn. Placenta delivered spontaneously with gentle cord traction. Fundus firm initially firm with low tone in lower uterine segment which imporved with massage, removal of clots with manual lower uterine sweep, Methergine, and Pitocin. ? ?Labia, perineum, vagina, and cervix inspected. Second degree perineal laceration noted with multiple extensions in stellate pattern that were found  to be bleeding. Additionally bleeding laceration noted next to site of urinary foley catheter which was repaired with 4-0 vicryl. Second degree laceration repaired with 3-0 Vicryl and then 2-0 vicryl. Continued to have bleeding from posterior abrasions/extensions despite multiple sutures and therefore Dr. Vergie Living called in to assess. More stitches placed at site of tear adjacent to catheter and ultimately bleeding mostly hemostatic with some oozing. ? ?A tagged sponge  was left in for packing for 20 minutes after repair complete. Returned to patient's bedside and removed lap. No trickling or bleeding noted after removal.  ? ?Placenta: intact, 3V cord, to L&D ?Complications: extensive bleeding from lacerations as described above, hemorrhage with EBL 1080 ?Lacerations: 2nd degree with multiple extensions and abrasions ?EBL: 1080 ?Analgesia: epidural ? ?Infant: female APGARs 26,9  3050g ? ?Warner Mccreedy, MD, MPH ?OB Fellow, Faculty Practice ?[facility] [employer] ?11/30/2021, 5:56 AM ? ? ? ? ? ?

## 2021-09-26 ENCOUNTER — Other Ambulatory Visit: Payer: Self-pay

## 2021-09-26 ENCOUNTER — Ambulatory Visit (INDEPENDENT_AMBULATORY_CARE_PROVIDER_SITE_OTHER): Payer: Medicaid Other | Admitting: Obstetrics and Gynecology

## 2021-09-26 VITALS — BP 103/72 | HR 76 | Wt 236.5 lb

## 2021-09-26 DIAGNOSIS — Z3480 Encounter for supervision of other normal pregnancy, unspecified trimester: Secondary | ICD-10-CM

## 2021-09-26 DIAGNOSIS — O09899 Supervision of other high risk pregnancies, unspecified trimester: Secondary | ICD-10-CM

## 2021-09-26 DIAGNOSIS — Z2839 Other underimmunization status: Secondary | ICD-10-CM

## 2021-09-26 NOTE — Progress Notes (Signed)
° °  PRENATAL VISIT NOTE  Subjective:  Paula Noble is a 21 y.o. G1P0 at 37w2dbeing seen today for ongoing prenatal care.  She is currently monitored for the following issues for this low-risk pregnancy and has Supervision of other normal pregnancy, antepartum and Rubella non-immune status, antepartum on their problem list.  Patient reports no complaints.  Contractions: Not present. Vag. Bleeding: None.  Movement: Present. Denies leaking of fluid.   The following portions of the patient's history were reviewed and updated as appropriate: allergies, current medications, past family history, past medical history, past social history, past surgical history and problem list.   Objective:   Vitals:   09/26/21 1138  BP: 103/72  Pulse: 76  Weight: 236 lb 8 oz (107.3 kg)    Fetal Status: Fetal Heart Rate (bpm): 150   Movement: Present     General:  Alert, oriented and cooperative. Patient is in no acute distress.  Skin: Skin is warm and dry. No rash noted.   Cardiovascular: Normal heart rate noted  Respiratory: Normal respiratory effort, no problems with respiration noted  Abdomen: Soft, gravid, appropriate for gestational age.  Pain/Pressure: Present     Pelvic: Cervical exam deferred        Extremities: Normal range of motion.  Edema: None  Mental Status: Normal mood and affect. Normal behavior. Normal judgment and thought content.   Assessment and Plan:  Pregnancy: G1P0 at 288w2d. Supervision of other normal pregnancy, antepartum Borderline anemia - hgb is 10.1. She is doing gummy pnv. She will get pnv with iron.  28 wk labs normal Discussed birth control options - she would like to do Nexplanon inpatient.  Discussed feeding - formula.   2. Rubella non-immune status, antepartum MMR postpartum  Preterm labor symptoms and general obstetric precautions including but not limited to vaginal bleeding, contractions, leaking of fluid and fetal movement were reviewed in detail with  the patient. Please refer to After Visit Summary for other counseling recommendations.   Return in about 2 weeks (around 10/10/2021) for OB VISIT, MD or APP.  Future Appointments  Date Time Provider DeDat[place]2/05/2022  8:[facility]/05/2022  8:45 AM [facility] US4 [facility]    PaRadene GunningMD

## 2021-10-08 NOTE — Progress Notes (Signed)
° °  PRENATAL VISIT NOTE  Subjective:  Paula Noble is a 21 y.o. G1P0 at 34w2dbeing seen today for ongoing prenatal care.  She is currently monitored for the following issues for this low-risk pregnancy and has Supervision of other normal pregnancy, antepartum and Rubella non-immune status, antepartum on their problem list.  Patient reports no complaints.  Contractions: Not present. Vag. Bleeding: None.  Movement: Present. Denies leaking of fluid.   The following portions of the patient's history were reviewed and updated as appropriate: allergies, current medications, past family history, past medical history, past social history, past surgical history and problem list.   Objective:   Vitals:   10/10/21 1602  BP: 108/68  Pulse: 91  Weight: 239 lb 3.2 oz (108.5 kg)    Fetal Status: Fetal Heart Rate (bpm): 160 Fundal Height: 30 cm Movement: Present     General:  Alert, oriented and cooperative. Patient is in no acute distress.  Skin: Skin is warm and dry. No rash noted.   Cardiovascular: Normal heart rate noted  Respiratory: Normal respiratory effort, no problems with respiration noted  Abdomen: Soft, gravid, appropriate for gestational age.  Pain/Pressure: Absent     Pelvic: Cervical exam deferred        Extremities: Normal range of motion.  Edema: None  Mental Status: Normal mood and affect. Normal behavior. Normal judgment and thought content.   Assessment and Plan:  Pregnancy: G1P0 at [redacted]w[redacted]d. Supervision of other normal pregnancy, antepartum Normal growth on 12/16. Has f/u growth on 2/9.   2. Rubella non-immune status, antepartum MMR after delivery - discussed with pt.   Preterm labor symptoms and general obstetric precautions including but not limited to vaginal bleeding, contractions, leaking of fluid and fetal movement were reviewed in detail with the patient. Please refer to After Visit Summary for other counseling recommendations.   Return in about 2 weeks  (around 10/24/2021) for OB VISIT, MD or APP.  Future Appointments  Date Time Provider [place]2/05/2022  8:30 [facility]/05/2022  8:45 AM [facility] US4 [facility]    PaRadene GunningMD

## 2021-10-10 ENCOUNTER — Other Ambulatory Visit: Payer: Self-pay

## 2021-10-10 ENCOUNTER — Ambulatory Visit (INDEPENDENT_AMBULATORY_CARE_PROVIDER_SITE_OTHER): Payer: Medicaid Other | Admitting: Obstetrics and Gynecology

## 2021-10-10 ENCOUNTER — Encounter: Payer: Self-pay | Admitting: Obstetrics and Gynecology

## 2021-10-10 VITALS — BP 108/68 | HR 91 | Wt 239.2 lb

## 2021-10-10 DIAGNOSIS — Z2839 Other underimmunization status: Secondary | ICD-10-CM

## 2021-10-10 DIAGNOSIS — Z3480 Encounter for supervision of other normal pregnancy, unspecified trimester: Secondary | ICD-10-CM

## 2021-10-10 DIAGNOSIS — O09899 Supervision of other high risk pregnancies, unspecified trimester: Secondary | ICD-10-CM

## 2021-10-24 ENCOUNTER — Other Ambulatory Visit: Payer: Self-pay

## 2021-10-24 ENCOUNTER — Ambulatory Visit (INDEPENDENT_AMBULATORY_CARE_PROVIDER_SITE_OTHER): Payer: Medicaid Other | Admitting: Certified Nurse Midwife

## 2021-10-24 VITALS — BP 110/68 | HR 85 | Wt 242.0 lb

## 2021-10-24 DIAGNOSIS — Z3A32 32 weeks gestation of pregnancy: Secondary | ICD-10-CM

## 2021-10-24 DIAGNOSIS — Z3493 Encounter for supervision of normal pregnancy, unspecified, third trimester: Secondary | ICD-10-CM

## 2021-10-25 ENCOUNTER — Encounter: Payer: Self-pay | Admitting: *Deleted

## 2021-10-25 ENCOUNTER — Ambulatory Visit: Payer: Medicaid Other | Attending: Obstetrics and Gynecology

## 2021-10-25 ENCOUNTER — Other Ambulatory Visit: Payer: Self-pay | Admitting: *Deleted

## 2021-10-25 ENCOUNTER — Ambulatory Visit: Payer: Medicaid Other | Admitting: *Deleted

## 2021-10-25 VITALS — BP 109/63 | HR 91

## 2021-10-25 DIAGNOSIS — O99212 Obesity complicating pregnancy, second trimester: Secondary | ICD-10-CM | POA: Insufficient documentation

## 2021-10-25 DIAGNOSIS — Z6841 Body Mass Index (BMI) 40.0 and over, adult: Secondary | ICD-10-CM | POA: Diagnosis not present

## 2021-10-25 DIAGNOSIS — O99333 Smoking (tobacco) complicating pregnancy, third trimester: Secondary | ICD-10-CM

## 2021-10-25 DIAGNOSIS — Z3A32 32 weeks gestation of pregnancy: Secondary | ICD-10-CM | POA: Diagnosis not present

## 2021-10-25 DIAGNOSIS — D509 Iron deficiency anemia, unspecified: Secondary | ICD-10-CM

## 2021-10-25 DIAGNOSIS — O26899 Other specified pregnancy related conditions, unspecified trimester: Secondary | ICD-10-CM | POA: Diagnosis not present

## 2021-10-25 DIAGNOSIS — E668 Other obesity: Secondary | ICD-10-CM

## 2021-10-25 DIAGNOSIS — O26849 Uterine size-date discrepancy, unspecified trimester: Secondary | ICD-10-CM

## 2021-10-27 NOTE — Progress Notes (Signed)
° °  PRENATAL VISIT NOTE  Subjective:  Paula Noble is a 21 y.o. G1P0 at [redacted]w[redacted]d being seen today for ongoing prenatal care.  She is currently monitored for the following issues for this low-risk pregnancy and has Supervision of other normal pregnancy, antepartum and Rubella non-immune status, antepartum on their problem list.  Patient reports no complaints.  Contractions: Not present. Vag. Bleeding: None.  Movement: Present. Denies leaking of fluid.   The following portions of the patient's history were reviewed and updated as appropriate: allergies, current medications, past family history, past medical history, past social history, past surgical history and problem list.   Objective:   Vitals:   10/24/21 1029  BP: 110/68  Pulse: 85  Weight: 242 lb (109.8 kg)    Fetal Status: Fetal Heart Rate (bpm): 152 Fundal Height: 32 cm Movement: Present     General:  Alert, oriented and cooperative. Patient is in no acute distress.  Skin: Skin is warm and dry. No rash noted.   Cardiovascular: Normal heart rate noted  Respiratory: Normal respiratory effort, no problems with respiration noted  Abdomen: Soft, gravid, appropriate for gestational age.  Pain/Pressure: Absent     Pelvic: Cervical exam deferred        Extremities: Normal range of motion.     Mental Status: Normal mood and affect. Normal behavior. Normal judgment and thought content.   Assessment and Plan:  Pregnancy: G1P0 at [redacted]w[redacted]d 1. Supervision of low-risk pregnancy, third trimester - Doing well, feeling regular and vigorous fetal movement   2. [redacted] weeks gestation of pregnancy - Routine OB care   Preterm labor symptoms and general obstetric precautions including but not limited to vaginal bleeding, contractions, leaking of fluid and fetal movement were reviewed in detail with the patient. Please refer to After Visit Summary for other counseling recommendations.   Return in about 2 weeks (around 11/07/2021) for IN-PERSON,  LOB.  Future Appointments  Date Time Provider Department Center  11/09/2021  8:55 AM Kathlene Cote [facility]  11/09/2021  2:45 PM [facility] NURSE [facility]  11/09/2021  3:00 PM [facility] US1 [facility]  11/15/2021  7:30 AM [facility] NURSE [facility]  11/15/2021  7:45 AM [facility] US6 [facility]  11/22/2021  7:30 AM [facility] NURSE [facility]  11/22/2021  7:45 AM [facility] US4 [facility]    Bernerd Limbo, CNM

## 2021-10-29 ENCOUNTER — Encounter: Payer: Self-pay | Admitting: Certified Nurse Midwife

## 2021-11-09 ENCOUNTER — Ambulatory Visit: Payer: Medicaid Other | Attending: Obstetrics and Gynecology

## 2021-11-09 ENCOUNTER — Encounter: Payer: Self-pay | Admitting: Medical

## 2021-11-09 ENCOUNTER — Ambulatory Visit: Payer: Medicaid Other | Admitting: *Deleted

## 2021-11-09 ENCOUNTER — Other Ambulatory Visit: Payer: Self-pay

## 2021-11-09 ENCOUNTER — Ambulatory Visit (INDEPENDENT_AMBULATORY_CARE_PROVIDER_SITE_OTHER): Payer: Medicaid Other | Admitting: Medical

## 2021-11-09 VITALS — BP 110/66 | HR 90 | Wt 244.5 lb

## 2021-11-09 VITALS — BP 120/68 | HR 95

## 2021-11-09 DIAGNOSIS — Z3A34 34 weeks gestation of pregnancy: Secondary | ICD-10-CM

## 2021-11-09 DIAGNOSIS — Z3480 Encounter for supervision of other normal pregnancy, unspecified trimester: Secondary | ICD-10-CM

## 2021-11-09 DIAGNOSIS — O99333 Smoking (tobacco) complicating pregnancy, third trimester: Secondary | ICD-10-CM | POA: Insufficient documentation

## 2021-11-09 DIAGNOSIS — O09899 Supervision of other high risk pregnancies, unspecified trimester: Secondary | ICD-10-CM

## 2021-11-09 DIAGNOSIS — Z6841 Body Mass Index (BMI) 40.0 and over, adult: Secondary | ICD-10-CM | POA: Diagnosis not present

## 2021-11-09 DIAGNOSIS — O26843 Uterine size-date discrepancy, third trimester: Secondary | ICD-10-CM | POA: Diagnosis not present

## 2021-11-09 DIAGNOSIS — D509 Iron deficiency anemia, unspecified: Secondary | ICD-10-CM | POA: Diagnosis not present

## 2021-11-09 DIAGNOSIS — Z3490 Encounter for supervision of normal pregnancy, unspecified, unspecified trimester: Secondary | ICD-10-CM

## 2021-11-09 DIAGNOSIS — O99210 Obesity complicating pregnancy, unspecified trimester: Secondary | ICD-10-CM

## 2021-11-09 DIAGNOSIS — O99213 Obesity complicating pregnancy, third trimester: Secondary | ICD-10-CM

## 2021-11-09 DIAGNOSIS — O26849 Uterine size-date discrepancy, unspecified trimester: Secondary | ICD-10-CM | POA: Diagnosis not present

## 2021-11-09 DIAGNOSIS — Z2839 Other underimmunization status: Secondary | ICD-10-CM

## 2021-11-09 NOTE — Patient Instructions (Signed)
AREA PEDIATRIC/FAMILY PRACTICE PHYSICIANS  Central/[place]outheast [place] ([address]) [facility] Chambliss, MD; Eniola, MD; Hale, MD; Hensel, MD; McDiarmid, MD; McIntyer, MD; Neal, MD; Walden, MD [address] [place], [place] [address] [phone] Mon-Fri 8:30-12:30, 1:30-5:00 Providers come to see babies at [facility] Accepting Medicaid [facility] providers who accept newborns: Koirala, MD; Morrow, MD; Wolters, MD [address], [place], [place] [address] [phone] Mon-Fri 8:00-5:30 Babies seen by providers at [facility] Does NOT accept Medicaid Please call early in hospitalization for appointment (limited availability)  [facility] Mulberry, MD [address] [place], [place] [address] [phone] Mon, Tue, Thur, Fri 8:30-5:00, Wed 10:00-7:00 (closed 1-2pm) Babies seen by [facility] providers Accepting Medicaid Rubin - Pediatrician Rubin, MD [address], [place], [place] [address] [phone] Mon-Fri 8:30-5:00, Sat 8:30-12:00 Provider comes to see babies at [facility] Accepting Medicaid Must have been referred from current patients or contacted office prior to delivery [facility] ([facility]) Brown, MD; Chandler, MD; Ettefagh, MD; Grant, MD; Lester, MD; McCormick, MD; McQueen, MD; Prose, MD; Simha, MD; Stanley, MD; Stryffeler, NP; Tebben, NP [address], [place], [place] [address] [phone] Mon, Tue, Thur, Fri 8:30-5:30, Wed 9:30-5:30, Sat 8:30-12:30 Babies seen by [facility] providers Accepting Medicaid Only accepting infants of first-time parents or siblings of current patients Hospital discharge coordinator will make follow-up appointment Jack Amos [address], [place], [place]  [address] [phone]   Fax - [phone] [facility] [address], [place], [place]  [address] Phone - [phone]   Fax - [phone] Shilpa Gosrani [address], [place], [place]  [address] [phone]  East/[place]ortheast [place] ([address]) [facility] Bates, MD; Brassfield, MD; Cooper, Cox, MD; MD; Davis, MD; Dovico, MD; Ettefaugh, MD; Little, MD; Lowe, MD; Keiffer, MD; Melvin, MD; Sumner, MD; Williams, MD [address], [place], [place] [address] [phone] Mon-Fri 8:30-5:00 (extended evenings Mon-Thur as needed), Sat-Sun 10:00-1:00 Providers come to see babies at [facility] Accepting Medicaid for families of first-time babies and families with all children in the household age 3 and under. Must register with office prior to making appointment (M-F only). [facility] Henson, NP; Knapp, MD; Lalonde, MD; Tysinger, PA [address] [place], [place] [address] [phone] Mon-Fri 8:00-5:00 Babies seen by providers at [facility] Does NOT accept Medicaid/Commercial Insurance Only [facility] ([facility])  Artis, MD; Barnes, MD; Bratton, MD; Coccaro, MD; Lockett Gardner, MD; Kramer, MD; Marshall, MD; Netherton, MD; Poleto, MD; Skinner, MD [address] [place], [place] [address] [phone] Mon-Fri 8:30-5:30, Sat (Oct.-Mar.) 9:00-1:00 Babies seen by providers at [facility] Accepting Medicaid  [place] ([address]) [facility] Reid, MD; Warner, MD [address], [place], [place] [address] [phone] Mon-Fri 8:30-5:00, Sat 8:30-12:00 Providers come to see babies at [facility] Does NOT accept Medicaid [facility] Becker, PA; Hagler, MD; Scifres, PA; Sun, MD; Swayne, MD [address], [place], [place] [address] [phone] Mon-Fri 8:00-5:00 Babies seen by providers at [facility] Does NOT accept Medicaid Only accepting babies of parents who  are patients Please call early in hospitalization for appointment (limited availability) [facility] Clark, MD; Frye, MD; Kelleher, MD; Mack, NP; Miller, MD; O'Keller, MD; Patterson, NP; Pudlo, MD; Puzio, MD; Thomas, MD; Tucker, MD; Twiselton, MD [address], [place], [place] [address] [phone] Mon-Fri 8:00-5:00, Sat 9:00-12:00 Providers come to see babies at [facility] Does NOT accept Medicaid  [place] ([address]) [facility] providers accepting new patients: Brake, NP; Wharton, PA [address], [place], [place] [address] [phone] Mon-Fri 8:00-5:00 Babies seen by providers at [facility] Does NOT accept Medicaid Only accepting babies of parents who are patients Please call early in hospitalization for appointment (limited availability) [facility] Gay, MD; Quinlan, MD [address] [place], [place] [address] [phone] (press 1 to schedule appointment) Mon-Fri 8:00-5:00 Providers come to see babies at [facility] Does NOT accept Medicaid [facility] Mazer, MD [address] [place], [place] [address] [phone] Mon-Fri 8:30-5:00 (lunch 12:30-1:00), extended hours by appointment only Wed 5:00-6:30 Babies seen by [facility] providers Accepting Medicaid [facility] Banks, MD; Jordan, MD; Koberlein, MD [address], [place], [place] [address] [phone] Mon-Fri 8:00-5:00 Babies seen by [facility] providers Does NOT accept Medicaid [facility] Parker, MD; [place], MD; Wallace, DO [address] [place], [place] [address] [phone] Mon-Fri 8:00-5:00 Babies seen by [facility] providers Does NOT accept Medicaid [facility] Brandon, PA; Brecken, PA; Christy, NP; Dees, MD; DeClaire, MD; DeWeese, MD; Hansen, NP; Mills, NP; Parrish, NP; Smoot, NP; Summer, MD; Vapne,  MD [address] [place], [place] [address] [phone] Mon-Fri 8:30-5:00, Sat 10:00-1:00 Providers come to see babies at [facility] Does NOT accept Medicaid Free prenatal information session Tuesdays at 4:45pm [facility] Bouska, MD; Gordon, PA; Jeffery, PA; Weber, PA [address] [place] [address] [phone] Mon-Fri 7:30-5:30 Babies seen by [facility] providers [facility] [address], [place], [place]  [address] [phone]   Fax - [phone]  [place] ([address] & [address]) [facility] [place] [address] [place], [place] [address] [phone] Mon-Thur 8:00-6:00 Providers come to see babies at [facility] Accepting Medicaid [facility] Anderson, NP; Badger, MD; Beal, PA; Spencer, PA [address] [place], [place] [address] [phone] Mon-Thur 7:30-7:30, Fri 7:30-4:30 Babies seen by [facility] providers Accepting Medicaid [facility] Agbuya, MD; Klett, NP; Romgoolam, MD [address], [place], [place] [address] [phone] Mon-Fri 8:30-5:00, Sat 8:30-12:00 Providers come to see babies at [facility] Accepting Medicaid Must have "Meet & Greet" appointment at office prior to delivery [facility] ([facility]) McCord, MD; Wallace, MD; Wood, MD [address], [place], [place] [address] [phone] Mon-Wed 8:00-6:00, Thur-Fri 8:00-5:00, Sat 9:00-12:00 Providers come to see babies at [facility] Does NOT accept Medicaid Only accepting siblings of current patients [facility]  [address], [place], [place]  [address] [phone]   Fax - [phone] [facility] [address], [place], [place]  [address] [phone]   Fax -  [phone]  [place] ([address] & [address]) [facility] Cirigliano, DO; Matthews, DO [address] , [place] [address] [phone] Mon-Fri 7:00-5:00 Babies seen by [facility] providers Does NOT accept Medicaid [facility] Briscoe, MD; Howley, PA; Moreira, PA [address], [place], [place] [address] [phone] Mon-Fri 8:00-5:00 Babies seen by [facility] providers Accepting Medicaid [facility] Boyd, MD; Church, PA; Jones, NP; Osborn, PA [address], ,  [address] [phone] Mon-Fri 8:00-5:00 Babies seen by providers at [facility] Accepting Medicaid  [place] ([address]) [facility] [place] Wendling, DO [address] [place], [place] [address] [phone] Mon-Fri 8:00-5:00 Babies seen by [facility] providers Does NOT accept Medicaid Limited availability, please call early in hospitalization to schedule follow-up [facility] Calderon, PA; Cummings, MD; Dillard, MD; Martin, PA; Olson, MD; VanDeven, PA [address], [place], [place] [address] [phone] Mon-Fri 8:30-5:00, Sat 9:00-12:00 Babies seen by providers at [facility] Accepting Medicaid Please register online then schedule online or call office [email or web address] [place], NP; Kumar, MD; Martin Rogers, PA [address], [place], [place] [address] [phone] Mon-Fri 8:00-5:00 Babies seen by providers at [facility] Accepting Medicaid [facility] ([facility]) Valentine, MD; Kristi Fleenor, NP; West, MD [address], [place], [place] [address] [phone] Mon-Fri 8:00-5:30, Sat&Sun by appointment (phones open at  8:30) Babies seen by [facility] providers Accepting Medicaid Must be a first-time baby or sibling of current patient [facility] [place]  [address], [place], [place]  [address] [phone]   Fax - [phone]  [place] ([address]) [facility] Brown, PA; Cowen, PA; Rice, MD; Helton, PA; Spry, MD [address] [place], [place] [address] [phone] Mon-Thur 8:00-7:00, Fri 8:00-5:00, Sat 8:00-12:00, Sun 9:00-12:00 Babies seen by [facility] providers Accepting Medicaid [facility] [place] Coe-Goins, MD; Marshall, MD; Pierre-Louis, MD [address], [place], [place] [address] [phone] Mon-Thur 8:00-5:00 Babies seen by providers at [facility] Accepting Medicaid [facility] Bratton, MD; Coe-Goins, MD; Hayes, MD; Lewis, MD; List, MD; Lott, MD; Marshall, MD; Moran, MD; O'Neal, MD; Pierre-Louis, MD; Pitonzo, MD; Scholer, MD; Spangle, MD [address] [place], [place] [address] [phone] Mon-Fri 8:00-5:30, Sat (Oct.-Mar.) 9:00-1:00 Babies seen by providers at [facility] Accepting Medicaid Must fill out new patient packet, available online at [email or web address] [facility] ([facility]) Friddle, NP; Harris, NP; Kelly, NP; Logan, MD; Melvin, PA; Poth, MD; Ramadoss, MD; Stanton, NP [address], [place], [place] [address] [phone] Mon-Thur 8:00-5:30, Fri 8:00-5:00 Babies seen by providers at [facility] Accepting Medicaid  [place] ([address]) [facility] Dixon, PA; Astoria, MD; Pickard, MD; Tapia, PA [address], [place], [place] [address] [phone] Mon-Fri 8:00-5:00 Babies seen by providers at [facility] Accepting Medicaid   [place] ([address]) [facility] [place] Masneri, DO; Meyers, MD; Nelson, PA [address], [place], [place] [address] [phone] Mon-Fri 8:00-5:00 Babies seen by providers at [facility] Does NOT accept Medicaid Limited appointment availability, please call early in hospitalization  [facility] [place] Kunedd, DO; McGowen, MD [address], [place], [place] [address] [phone] Mon-Fri 8:00-5:00 Babies seen by [facility] providers Does NOT accept Medicaid [facility] [place] Cameron, MD; MacDonald, MD; Michaels, PA; Nayak, MD [address] [place] [address], [place], [place] [address] [phone] Mon-Fri 8:00-5:00 After hours clinic ([address] [place], [place] [address]) [phone] Mon-Fri 5:00-8:00, Sat 12:00-6:00, Sun 10:00-4:00 Babies seen by [facility] providers Accepting Medicaid [facility] [place] [address], [place], [place]  [address] [phone]   Fax - [phone]  [place] ([address]) [facility] [place] [facility] Andy, MD [address], [place], [place] [address] [phone] Mon-Fri 8:00-5:00 Babies seen by [facility] providers Does NOT accept Medicaid [facility] [place] ([facility] [place]) Eksir, MD [address], [place], [place] [address] [phone] Mon-Thur 8:00-7:00, Fri 8:00-5:00, Sat 8:00-12:00 Babies seen by providers at [facility] Accepting Medicaid - but does not have vaccinations in office (must be received elsewhere) Limited availability, please call early in hospitalization  [place] ([address]) [facility]  Charlene Flemming, MD [address], [place] [address] [phone]  Fax [phone]  [facility]  Kimberly Newton, MD, Annamarie Streilein, PA, Carla Hampton, PA [address] [place], [place]  [address] [phone] [facility]  [address], [place], [place] [address] [phone] [address], [place], [place] [address] [phone] (West Office)  [facility] [address], [place], [place] [address] [phone] [facility] [address], [place], [place] [address] [phone] [facility] [address], [place], [place] [address] [phone] [facility] [address], [place], [place] [address] [phone] [facility] [address], [place], [place] [address] [phone] [facility] [address], [place], [place] [address] [phone] [facility]  [address], [place], [place] [address] [phone] [facility] [address] [place] [address], [place], [place] [address] [phone] [facility] [address], [address], [place],  [address] [phone] [facility] [address], [place],  [address] [phone]  

## 2021-11-09 NOTE — Progress Notes (Signed)
° °  PRENATAL VISIT NOTE  Subjective:  Paula Noble is a 21 y.o. G1P0 at 108w4d being seen today for ongoing prenatal care.  She is currently monitored for the following issues for this low-risk pregnancy and has Supervision of other normal pregnancy, antepartum and Rubella non-immune status, antepartum on their problem list.  Patient reports backache.  Contractions: Not present. Vag. Bleeding: None.  Movement: Present. Denies leaking of fluid.   The following portions of the patient's history were reviewed and updated as appropriate: allergies, current medications, past family history, past medical history, past social history, past surgical history and problem list.   Objective:   Vitals:   11/09/21 0907  BP: 110/66  Pulse: 90  Weight: 244 lb 8 oz (110.9 kg)    Fetal Status: Fetal Heart Rate (bpm): 137   Movement: Present     General:  Alert, oriented and cooperative. Patient is in no acute distress.  Skin: Skin is warm and dry. No rash noted.   Cardiovascular: Normal heart rate noted  Respiratory: Normal respiratory effort, no problems with respiration noted  Abdomen: Soft, gravid, appropriate for gestational age.  Pain/Pressure: Present     Pelvic: Cervical exam deferred        Extremities: Normal range of motion.  Edema: None  Mental Status: Normal mood and affect. Normal behavior. Normal judgment and thought content.   Assessment and Plan:  Pregnancy: G1P0 at [redacted]w[redacted]d 1. Supervision of other normal pregnancy, antepartum - Maternity belt advised for back pain  - Follow-up US today for growth  - Peds list given and importance of choosing Peds prior to delivery discussed   2. Rubella non-immune status, antepartum - PP MMR planned   3. [redacted] weeks gestation of pregnancy  Preterm labor symptoms and general obstetric precautions including but not limited to vaginal bleeding, contractions, leaking of fluid and fetal movement were reviewed in detail with the patient. Please  refer to After Visit Summary for other counseling recommendations.   Return in about 2 weeks (around 11/23/2021) for LOB, In-Person, any provider.  Future Appointments  Date Time Provider [place]  11/09/2021  2:45 PM [facility] NURSE [facility]  11/09/2021  3:00 PM [facility] US1 [facility]  11/15/2021  7:30 AM [facility] NURSE [facility]  11/15/2021  7:45 AM [facility] US6 [facility]  11/22/2021  7:30 AM [facility] NURSE [facility]  11/22/2021  7:45 AM [facility] US4 [facility]    Kerry Hough, PA-C

## 2021-11-15 ENCOUNTER — Ambulatory Visit: Payer: Medicaid Other | Admitting: *Deleted

## 2021-11-15 ENCOUNTER — Other Ambulatory Visit: Payer: Self-pay

## 2021-11-15 ENCOUNTER — Encounter: Payer: Self-pay | Admitting: *Deleted

## 2021-11-15 ENCOUNTER — Other Ambulatory Visit: Payer: Self-pay | Admitting: *Deleted

## 2021-11-15 ENCOUNTER — Ambulatory Visit: Payer: Medicaid Other | Attending: Obstetrics and Gynecology

## 2021-11-15 VITALS — BP 101/64 | HR 78

## 2021-11-15 DIAGNOSIS — O99333 Smoking (tobacco) complicating pregnancy, third trimester: Secondary | ICD-10-CM

## 2021-11-15 DIAGNOSIS — O26843 Uterine size-date discrepancy, third trimester: Secondary | ICD-10-CM

## 2021-11-15 DIAGNOSIS — D509 Iron deficiency anemia, unspecified: Secondary | ICD-10-CM | POA: Insufficient documentation

## 2021-11-15 DIAGNOSIS — Z3A35 35 weeks gestation of pregnancy: Secondary | ICD-10-CM | POA: Insufficient documentation

## 2021-11-15 DIAGNOSIS — O99013 Anemia complicating pregnancy, third trimester: Secondary | ICD-10-CM | POA: Diagnosis not present

## 2021-11-15 DIAGNOSIS — F17290 Nicotine dependence, other tobacco product, uncomplicated: Secondary | ICD-10-CM

## 2021-11-15 DIAGNOSIS — Z6841 Body Mass Index (BMI) 40.0 and over, adult: Secondary | ICD-10-CM

## 2021-11-15 DIAGNOSIS — R638 Other symptoms and signs concerning food and fluid intake: Secondary | ICD-10-CM

## 2021-11-15 DIAGNOSIS — O99213 Obesity complicating pregnancy, third trimester: Secondary | ICD-10-CM | POA: Diagnosis not present

## 2021-11-15 DIAGNOSIS — O26849 Uterine size-date discrepancy, unspecified trimester: Secondary | ICD-10-CM

## 2021-11-22 ENCOUNTER — Other Ambulatory Visit: Payer: Self-pay

## 2021-11-22 ENCOUNTER — Other Ambulatory Visit: Payer: Self-pay | Admitting: Obstetrics and Gynecology

## 2021-11-22 ENCOUNTER — Ambulatory Visit: Payer: Medicaid Other | Admitting: *Deleted

## 2021-11-22 ENCOUNTER — Ambulatory Visit: Payer: Medicaid Other | Attending: Obstetrics and Gynecology

## 2021-11-22 ENCOUNTER — Encounter: Payer: Self-pay | Admitting: *Deleted

## 2021-11-22 VITALS — BP 114/66 | HR 74

## 2021-11-22 DIAGNOSIS — O99213 Obesity complicating pregnancy, third trimester: Secondary | ICD-10-CM

## 2021-11-22 DIAGNOSIS — F17290 Nicotine dependence, other tobacco product, uncomplicated: Secondary | ICD-10-CM | POA: Insufficient documentation

## 2021-11-22 DIAGNOSIS — E669 Obesity, unspecified: Secondary | ICD-10-CM | POA: Diagnosis not present

## 2021-11-22 DIAGNOSIS — F17293 Nicotine dependence, other tobacco product, with withdrawal: Secondary | ICD-10-CM | POA: Diagnosis not present

## 2021-11-22 DIAGNOSIS — O99013 Anemia complicating pregnancy, third trimester: Secondary | ICD-10-CM | POA: Diagnosis not present

## 2021-11-22 DIAGNOSIS — O26849 Uterine size-date discrepancy, unspecified trimester: Secondary | ICD-10-CM

## 2021-11-22 DIAGNOSIS — Z3A36 36 weeks gestation of pregnancy: Secondary | ICD-10-CM | POA: Insufficient documentation

## 2021-11-22 DIAGNOSIS — O99333 Smoking (tobacco) complicating pregnancy, third trimester: Secondary | ICD-10-CM

## 2021-11-22 DIAGNOSIS — Z6841 Body Mass Index (BMI) 40.0 and over, adult: Secondary | ICD-10-CM

## 2021-11-22 DIAGNOSIS — D509 Iron deficiency anemia, unspecified: Secondary | ICD-10-CM

## 2021-11-22 NOTE — Procedures (Signed)
Paula Noble ?[birth year] ?[redacted]w[redacted]d ? ?Fetus A Non-Stress Test Interpretation for 11/22/21 ? ?Indication: Unsatisfactory BPP ? ?Fetal Heart Rate A ?Mode: External ?Baseline Rate (A): 140 bpm ?Variability: Moderate ?Accelerations: 15 x 15 ?Decelerations: None ?Multiple birth?: No ? ?Uterine Activity ?Mode: Toco ?Contraction Frequency (min): none ?Resting Tone Palpated: Relaxed ? ?Interpretation (Fetal Testing) ?Nonstress Test Interpretation: Reactive ?Overall Impression: Reassuring for gestational age ?Comments: tracing reviewedby Dr. Donalee Citrin ? ? ?

## 2021-11-26 ENCOUNTER — Other Ambulatory Visit: Payer: Self-pay

## 2021-11-26 ENCOUNTER — Other Ambulatory Visit (HOSPITAL_COMMUNITY)
Admission: RE | Admit: 2021-11-26 | Discharge: 2021-11-26 | Disposition: A | Discharge status: Home / Self Care | Payer: Medicaid Other | Source: Ambulatory Visit | Attending: Obstetrics & Gynecology | Admitting: Obstetrics & Gynecology

## 2021-11-26 ENCOUNTER — Ambulatory Visit (INDEPENDENT_AMBULATORY_CARE_PROVIDER_SITE_OTHER): Payer: Medicaid Other | Admitting: Obstetrics & Gynecology

## 2021-11-26 VITALS — BP 108/82 | HR 75 | Wt 242.0 lb

## 2021-11-26 DIAGNOSIS — Z3480 Encounter for supervision of other normal pregnancy, unspecified trimester: Secondary | ICD-10-CM | POA: Insufficient documentation

## 2021-11-26 DIAGNOSIS — Z3A37 37 weeks gestation of pregnancy: Secondary | ICD-10-CM

## 2021-11-26 LAB — OB RESULTS CONSOLE GC/CHLAMYDIA: Gonorrhea: NEGATIVE

## 2021-11-26 NOTE — Progress Notes (Signed)
? ?  PRENATAL VISIT NOTE ? ?Subjective:  ?Paula Noble is a 21 y.o. G1P0 at [redacted]w[redacted]d being seen today for ongoing prenatal care.  She is currently monitored for the following issues for this high-risk pregnancy and has Supervision of other normal pregnancy, antepartum and Rubella non-immune status, antepartum on their problem list. ? ?Patient reports no complaints.  Contractions: Not present. Vag. Bleeding: None.  Movement: Present. Denies leaking of fluid.  ? ?The following portions of the patient's history were reviewed and updated as appropriate: allergies, current medications, past family history, past medical history, past social history, past surgical history and problem list.  ? ?Objective:  ? ?Vitals:  ? 11/26/21 1104  ?BP: 108/82  ?Pulse: 75  ?Weight: 242 lb (109.8 kg)  ? ? ?Fetal Status: Fetal Heart Rate (bpm): 137   Movement: Present    ? ?General:  Alert, oriented and cooperative. Patient is in no acute distress.  ?Skin: Skin is warm and dry. No rash noted.   ?Cardiovascular: Normal heart rate noted  ?Respiratory: Normal respiratory effort, no problems with respiration noted  ?Abdomen: Soft, gravid, appropriate for gestational age.  Pain/Pressure: Present     ?Pelvic: Cervical exam performed in the presence of a chaperone        ?Extremities: Normal range of motion.  Edema: None  ?Mental Status: Normal mood and affect. Normal behavior. Normal judgment and thought content.  ? ?Assessment and Plan:  ?Pregnancy: G1P0 at [redacted]w[redacted]d ?1. Supervision of other normal pregnancy, antepartum ?Doing well ? ? ?Term labor symptoms and general obstetric precautions including but not limited to vaginal bleeding, contractions, leaking of fluid and fetal movement were reviewed in detail with the patient. ?Please refer to After Visit Summary for other counseling recommendations.  ? ?Return in about 1 week (around 12/03/2021). ? ?Future Appointments  ?Date Time Provider [place]  ?11/29/2021  7:15 AM [facility] NURSE  [facility]  ?11/29/2021  7:30 AM [facility] US2 [facility]  ?12/03/2021 10:55 AM Patriciaann Clan, DO [facility]  ?12/06/2021  7:30 AM [facility] NURSE [facility]  ?12/06/2021  7:45 AM [facility] US4 [facility]  ?12/10/2021 10:55 AM Griffin Basil, MD [facility]  ?12/17/2021 10:35 AM Hillard Danker Myles Rosenthal, PA-C [facility]  ?12/24/2021  9:15 AM [facility] NST [facility]  ?12/24/2021 10:55 AM Donnamae Jude, MD [facility]  ? ? ?Emeterio Reeve, MD ? ?

## 2021-11-27 LAB — GC/CHLAMYDIA PROBE AMP (~~LOC~~) NOT AT ARMC
Chlamydia: NEGATIVE
Comment: NEGATIVE
Comment: NORMAL
Neisseria Gonorrhea: NEGATIVE

## 2021-11-29 ENCOUNTER — Inpatient Hospital Stay (HOSPITAL_COMMUNITY)
Admission: AD | Admit: 2021-11-29 | Discharge: 2021-12-01 | DRG: 807 | Disposition: A | Discharge status: Home / Self Care | Payer: Medicaid Other | Source: Ambulatory Visit | Attending: Obstetrics and Gynecology | Admitting: Obstetrics and Gynecology

## 2021-11-29 ENCOUNTER — Encounter (HOSPITAL_COMMUNITY): Payer: Self-pay | Admitting: Obstetrics and Gynecology

## 2021-11-29 ENCOUNTER — Ambulatory Visit: Payer: Medicaid Other | Admitting: *Deleted

## 2021-11-29 ENCOUNTER — Other Ambulatory Visit: Payer: Self-pay

## 2021-11-29 ENCOUNTER — Ambulatory Visit (HOSPITAL_BASED_OUTPATIENT_CLINIC_OR_DEPARTMENT_OTHER): Payer: Medicaid Other

## 2021-11-29 VITALS — BP 122/66 | HR 60

## 2021-11-29 DIAGNOSIS — D649 Anemia, unspecified: Secondary | ICD-10-CM | POA: Diagnosis not present

## 2021-11-29 DIAGNOSIS — R638 Other symptoms and signs concerning food and fluid intake: Secondary | ICD-10-CM | POA: Insufficient documentation

## 2021-11-29 DIAGNOSIS — F17290 Nicotine dependence, other tobacco product, uncomplicated: Secondary | ICD-10-CM

## 2021-11-29 DIAGNOSIS — Z23 Encounter for immunization: Secondary | ICD-10-CM | POA: Diagnosis not present

## 2021-11-29 DIAGNOSIS — O368130 Decreased fetal movements, third trimester, not applicable or unspecified: Secondary | ICD-10-CM | POA: Diagnosis not present

## 2021-11-29 DIAGNOSIS — Z3A37 37 weeks gestation of pregnancy: Secondary | ICD-10-CM

## 2021-11-29 DIAGNOSIS — Z2839 Other underimmunization status: Secondary | ICD-10-CM

## 2021-11-29 DIAGNOSIS — O99213 Obesity complicating pregnancy, third trimester: Secondary | ICD-10-CM

## 2021-11-29 DIAGNOSIS — O99013 Anemia complicating pregnancy, third trimester: Secondary | ICD-10-CM

## 2021-11-29 DIAGNOSIS — D509 Iron deficiency anemia, unspecified: Secondary | ICD-10-CM

## 2021-11-29 DIAGNOSIS — Z3490 Encounter for supervision of normal pregnancy, unspecified, unspecified trimester: Secondary | ICD-10-CM

## 2021-11-29 DIAGNOSIS — O9902 Anemia complicating childbirth: Secondary | ICD-10-CM | POA: Diagnosis not present

## 2021-11-29 DIAGNOSIS — E669 Obesity, unspecified: Secondary | ICD-10-CM

## 2021-11-29 DIAGNOSIS — Z30017 Encounter for initial prescription of implantable subdermal contraceptive: Secondary | ICD-10-CM

## 2021-11-29 DIAGNOSIS — Z6841 Body Mass Index (BMI) 40.0 and over, adult: Secondary | ICD-10-CM | POA: Insufficient documentation

## 2021-11-29 DIAGNOSIS — O99333 Smoking (tobacco) complicating pregnancy, third trimester: Secondary | ICD-10-CM

## 2021-11-29 DIAGNOSIS — Z3480 Encounter for supervision of other normal pregnancy, unspecified trimester: Secondary | ICD-10-CM

## 2021-11-29 LAB — GROUP B STREP BY PCR: Group B strep by PCR: NEGATIVE

## 2021-11-29 LAB — CBC
HCT: 30.7 % — ABNORMAL LOW (ref 36.0–46.0)
Hemoglobin: 9.6 g/dL — ABNORMAL LOW (ref 12.0–15.0)
MCH: 25 pg — ABNORMAL LOW (ref 26.0–34.0)
MCHC: 31.3 g/dL (ref 30.0–36.0)
MCV: 79.9 fL — ABNORMAL LOW (ref 80.0–100.0)
Platelets: 215 10*3/uL (ref 150–400)
RBC: 3.84 MIL/uL — ABNORMAL LOW (ref 3.87–5.11)
RDW: 15.9 % — ABNORMAL HIGH (ref 11.5–15.5)
WBC: 6.2 10*3/uL (ref 4.0–10.5)
nRBC: 0 % (ref 0.0–0.2)

## 2021-11-29 LAB — RPR: RPR Ser Ql: NONREACTIVE

## 2021-11-29 LAB — TYPE AND SCREEN
ABO/RH(D): A POS
Antibody Screen: NEGATIVE

## 2021-11-29 MED ORDER — OXYTOCIN-SODIUM CHLORIDE 30-0.9 UT/500ML-% IV SOLN
1.0000 m[IU]/min | INTRAVENOUS | Status: DC
  Administered 2021-11-29: 2 m[IU]/min via INTRAVENOUS
  Filled 2021-11-29: qty 500

## 2021-11-29 MED ORDER — SOD CITRATE-CITRIC ACID 500-334 MG/5ML PO SOLN: 30.0000 mL | ORAL | Status: DC | PRN

## 2021-11-29 MED ORDER — MISOPROSTOL 50MCG HALF TABLET
50.0000 ug | ORAL_TABLET | ORAL | Status: DC
  Administered 2021-11-29: 50 ug via ORAL
  Filled 2021-11-29: qty 1

## 2021-11-29 MED ORDER — OXYTOCIN-SODIUM CHLORIDE 30-0.9 UT/500ML-% IV SOLN
2.5000 [IU]/h | INTRAVENOUS | Status: DC
  Administered 2021-11-30: 2.5 [IU]/h via INTRAVENOUS

## 2021-11-29 MED ORDER — LIDOCAINE HCL (PF) 1 % IJ SOLN: 30.0000 mL | INTRAMUSCULAR | Status: DC | PRN

## 2021-11-29 MED ORDER — OXYCODONE-ACETAMINOPHEN 5-325 MG PO TABS: 1.0000 | ORAL_TABLET | ORAL | Status: DC | PRN

## 2021-11-29 MED ORDER — ACETAMINOPHEN 325 MG PO TABS: 650.0000 mg | ORAL_TABLET | ORAL | Status: DC | PRN

## 2021-11-29 MED ORDER — OXYTOCIN BOLUS FROM INFUSION
333.0000 mL | Freq: Once | INTRAVENOUS | Status: AC
  Administered 2021-11-30: 333 mL via INTRAVENOUS

## 2021-11-29 MED ORDER — LACTATED RINGERS IV SOLN: INTRAVENOUS | Status: DC

## 2021-11-29 MED ORDER — LACTATED RINGERS IV SOLN: 500.0000 mL | INTRAVENOUS | Status: DC | PRN

## 2021-11-29 MED ORDER — OXYCODONE-ACETAMINOPHEN 5-325 MG PO TABS: 2.0000 | ORAL_TABLET | ORAL | Status: DC | PRN

## 2021-11-29 MED ORDER — FLEET ENEMA 7-19 GM/118ML RE ENEM: 1.0000 | ENEMA | RECTAL | Status: DC | PRN

## 2021-11-29 MED ORDER — ONDANSETRON HCL 4 MG/2ML IJ SOLN: 4.0000 mg | Freq: Four times a day (QID) | INTRAMUSCULAR | Status: DC | PRN

## 2021-11-29 MED ORDER — TERBUTALINE SULFATE 1 MG/ML IJ SOLN: 0.2500 mg | Freq: Once | INTRAMUSCULAR | Status: DC | PRN

## 2021-11-29 NOTE — MAU Note (Addendum)
Sent from MFM, decreased fetal movement. Pt had not eaten before appt. BPP was 2/8.  Reports  pelvic pain, no bleeding or leaking. Pt for adm, L&D unable to take. ?

## 2021-11-29 NOTE — Progress Notes (Signed)
Paula Noble is a 21 y.o. G1P0 at [redacted]w[redacted]d by ultrasound admitted for induction of labor due to BPP 2/8, decreased fetal movement. ? ?Subjective: ?Doing well, would like to proceed with foley balloon placement. ? ?Objective: ?BP (!) 130/98   Pulse 97   Temp 98.1 ?F (36.7 ?C) (Oral)   Resp 18   Ht 5\' 3"  (1.6 m)   Wt 108.9 kg   LMP 12/27/2020 Comment: neg preg test  SpO2 99%   BMI 42.51 kg/m?  ?No intake/output data recorded. ?No intake/output data recorded. ? ?FHT:  FHR: 140 bpm, variability: moderate,  accelerations:  Present,  decelerations:  Absent ?UC:   Occasional ?SVE:   Dilation: 2 ?Effacement (%): 50 ?Station: -2 ?Exam by:: Dr. [email or web address] ? ?Labs: ?Lab Results  ?Component Value Date  ? WBC 6.2 11/29/2021  ? HGB 9.6 (L) 11/29/2021  ? HCT 30.7 (L) 11/29/2021  ? MCV 79.9 (L) 11/29/2021  ? PLT 215 11/29/2021  ? ? ?Assessment / Plan: ?Induction of labor due to BPP 2/8, decreased fetal movement.  ? ?Labor: Cytotec x 1 at 1528 and foley balloon placed at 1604  ?Fetal Wellbeing:  Category I ?Pain Control:  Epidural ?I/D:   GBS negative ?Anticipated MOD:  NSVD ? ?12/01/2021 ?11/29/2021, 4:40 PM ? ? ?

## 2021-11-29 NOTE — H&P (Signed)
OBSTETRIC ADMISSION HISTORY AND PHYSICAL ? ?Paula Noble is a 21 y.o. female G1P0 at [redacted]w[redacted]d by [place] at 18wk4d presenting for IOL for BPP 2/8, decreased fetal movement. She reports +FMs, No LOF, no VB, no blurry vision, headaches or peripheral edema, and RUQ pain.  She plans on bottle (formula) feeding. She request Nexplanon for birth control. ?She received her prenatal care at [facility] ? ?Dating: By [redacted]w[redacted]d [place] --->  Estimated Date of Delivery: 12/17/21 ? ?Sono:   ? ?@[redacted]w[redacted]d , CWD, normal anatomy, cephalic presentation, posterior placental lie, 3015g, 62% EFW ? ? ?Prenatal History/Complications: ?BPP 2/8, Decreased Fetal Movement ?Rubella Non-Immune ? ?Past Medical History: ?Past Medical History:  ?Diagnosis Date  ? ? 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 Seen on mfm anatomy u/s at 18wks  - confirmed normal on 08/13/21 by fetal echo S/p low risk cffdna     ? Insulin resistance 01/06/2014  ? Microcytic anemia 09/12/2018  ? ? ?Past Surgical History: ?Past Surgical History:  ?Procedure Laterality Date  ? NO PAST SURGERIES    ? ? ?Obstetrical History: ?OB History   ? ? Gravida  ?1  ? Para  ?   ? Term  ?   ? Preterm  ?   ? AB  ?   ? Living  ?   ?  ? ? SAB  ?   ? IAB  ?   ? Ectopic  ?   ? Multiple  ?   ? Live Births  ?   ?   ?  ?  ? ? ?Social History ?Social History  ? ?Socioeconomic History  ? Marital status: Single  ?  Spouse name: Not on file  ? Number of children: Not on file  ? Years of education: Not on file  ? Highest education level: Not on file  ?Occupational History  ? Not on file  ?Tobacco Use  ? Smoking status: Former  ? Smokeless tobacco: Never  ? Tobacco comments:  ?  pt states it has been months  ?Vaping Use  ? Vaping Use: Former  ? Quit date: 05/03/2021  ? Substances: Nicotine, Flavoring  ?Substance and Sexual Activity  ? Alcohol use: Not Currently  ? Drug use: Not Currently  ?  Types: Marijuana  ?  Comment: last smoked 4 years ago  ? Sexual activity: Yes  ?  Birth control/protection: None  ?Other  Topics Concern  ? Not on file  ?Social History Narrative  ? Not on file  ? ?Social Determinants of Health  ? ?Financial Resource Strain: Not on file  ?Food Insecurity: No Food Insecurity  ? Worried About Charity fundraiser in the Last Year: Never true  ? Ran Out of Food in the Last Year: Never true  ?Transportation Needs: No Transportation Needs  ? Lack of Transportation (Medical): No  ? Lack of Transportation (Non-Medical): No  ?Physical Activity: Not on file  ?Stress: Not on file  ?Social Connections: Not on file  ? ? ?Family History: ?Family History  ?Problem Relation Age of Onset  ? Diabetes Mother   ? Healthy Father   ? ? ?Allergies: ?No Known Allergies ? ?Medications Prior to Admission  ?Medication Sig Dispense Refill Last Dose  ? Prenatal Vit-Fe Fumarate-FA (MULTIVITAMIN-PRENATAL) 27-0.8 MG TABS tablet Take 1 tablet by mouth daily at 12 noon.   11/29/2021 at 0700  ? Blood Pressure Monitoring DEVI 1 each by Does not apply route once a week. 1 each 0 Unknown  ? ? ? ?  Review of Systems  ? ?All systems reviewed and negative except as stated in HPI ? ?Blood pressure 108/72, pulse 89, temperature 98.4 ?F (36.9 ?C), temperature source Oral, resp. rate 18, height 5\' 3"  (1.6 m), weight 108.9 kg, last menstrual period 12/27/2020, SpO2 99 %. ?General appearance: alert, cooperative, and appears stated age ?Lungs: clear to auscultation bilaterally ?Heart: regular rate and rhythm ?Abdomen: soft, non-tender; bowel sounds normal ?Extremities: Homans sign is negative, no sign of DVT ?Presentation: cephalic ?Fetal monitoring Baseline: 140 bpm, Variability: Good {> 6 bpm), Accelerations: Reactive, and Decelerations: Absent ?Uterine activityNone ?Dilation: 2 ?Effacement (%): 50 ?Station: -2 ?Exam by:: Ronney Lion, RN ? ? ?Prenatal labs: ?ABO, Rh: --/--/PENDING (03/16 1115) ?Antibody: PENDING (03/16 1115) ?Rubella: <0.90 (09/09 1027) ?RPR: Non Reactive (12/28 0846)  ?HBsAg: Negative (09/09 1027)  ?HIV: Non Reactive (12/28 0846)   ?GBS:    ?2 hr Glucola normal ?Genetic screening: NIPS low risk, AFP negative, Horizon negative ?Anatomy US: Initial anatomy US with VSD and possible overriding aorta. Follow up fetal echo normal and follow up OB US with normal cardiac views.  ? ?Prenatal Transfer Tool  ?Maternal Diabetes: No ?Genetic Screening: Normal ?Maternal Ultrasounds/Referrals: Cardiac defect, anatomy scan with VSD and possible overriding aorta, follow up scans with normal anatomy ?Fetal Ultrasounds or other Referrals:  Fetal echo, normal ?Maternal Substance Abuse:  No ?Significant Maternal Medications:  None ?Significant Maternal Lab Results: Other: GBS Pending ? ?Results for orders placed or performed during the hospital encounter of 11/29/21 (from the past 24 hour(s))  ?CBC  ? Collection Time: 11/29/21 11:15 AM  ?Result Value Ref Range  ? WBC 6.2 4.0 - 10.5 K/uL  ? RBC 3.84 (L) 3.87 - 5.11 MIL/uL  ? Hemoglobin 9.6 (L) 12.0 - 15.0 g/dL  ? HCT 30.7 (L) 36.0 - 46.0 %  ? MCV 79.9 (L) 80.0 - 100.0 fL  ? MCH 25.0 (L) 26.0 - 34.0 pg  ? MCHC 31.3 30.0 - 36.0 g/dL  ? RDW 15.9 (H) 11.5 - 15.5 %  ? Platelets 215 150 - 400 K/uL  ? nRBC 0.0 0.0 - 0.2 %  ?Type and screen [place]  ? Collection Time: 11/29/21 11:15 AM  ?Result Value Ref Range  ? ABO/RH(D) PENDING   ? Antibody Screen PENDING   ? Sample Expiration    ?  12/02/2021,2359 ?Performed at [facility], [place] [address] [place], [place] [address] ?  ? ? ?Patient Active Problem List  ? Diagnosis Date Noted  ? Encounter for induction of labor 11/29/2021  ? Rubella non-immune status, antepartum 05/27/2021  ? Supervision of other normal pregnancy, antepartum 05/25/2021  ? ? ?Assessment/Plan:  ?[name] is a 21 y.o. G1P0 at [redacted]w[redacted]d here for IOL for BPP 2/8, decreased fetal movement ? ?#Labor: IOL for BPP 2/8, decreased fetal movement. 2 / 50 / -2 on first check, will plan for Cytotec and likely foley balloon.  ?#Pain: Epidural ?#FWB: Category 1 ?#ID:  GBS  Pending ?#MOF: Bottle, formula ?#MOC: Nexplanon ? ?#BPP 2/8, Decreased Fetal Movement: Reactive strip, will continue to monitor. ? ?Daniel Nones, MD  ?11/29/2021, 12:44 PM ? ? ? ?

## 2021-11-29 NOTE — Progress Notes (Addendum)
Paula Noble is a 21 y.o. G1P0 at [redacted]w[redacted]d by ultrasound admitted for induction of labor due to BPP 2/8, decreased fetal movement. ? ?Subjective: ?Feeling okay s/p Foley bulb fell out. Interested in epidural in future for pain control ? ?Objective: ?BP 125/74   Pulse 85   Temp 98.9 ?F (37.2 ?C) (Oral)   Resp 18   Ht 5\' 3"  (1.6 m)   Wt 108.9 kg   LMP 12/27/2020 Comment: neg preg test  SpO2 99%   BMI 42.51 kg/m?  ?No intake/output data recorded. ?No intake/output data recorded. ? ?FHT: FHR 140s, moderate varibility +accels, no decels ?SVE:   Dilation: 5.5 ?Effacement (%): 80 ?Station: -2 ?Exam by:: [email or web address] ? ?Labs: ?Lab Results  ?Component Value Date  ? WBC 6.2 11/29/2021  ? HGB 9.6 (L) 11/29/2021  ? HCT 30.7 (L) 11/29/2021  ? MCV 79.9 (L) 11/29/2021  ? PLT 215 11/29/2021  ? ? ?Assessment / Plan: ?Induction of labor due to BPP 2/8, decreased fetal movement.  ? ?Labor: Progressing well s/p cytotec x 1 at 1528 and foley balloon placed at 1604- now out @1955  and dilated to 5.5/80/-2. Pitocin started at 2024 ?Fetal Wellbeing:  Category I ?Pain Control:  Plan for epidural ?I/D:   GBS negative ? ?Maurilio Puryear ?11/29/2021, 10:16 PM ? ? ?

## 2021-11-30 ENCOUNTER — Encounter (HOSPITAL_COMMUNITY): Payer: Self-pay | Admitting: Family Medicine

## 2021-11-30 ENCOUNTER — Inpatient Hospital Stay (HOSPITAL_COMMUNITY): Payer: Medicaid Other | Admitting: Anesthesiology

## 2021-11-30 DIAGNOSIS — O9902 Anemia complicating childbirth: Secondary | ICD-10-CM

## 2021-11-30 DIAGNOSIS — Z3A37 37 weeks gestation of pregnancy: Secondary | ICD-10-CM

## 2021-11-30 LAB — CBC
HCT: 24.3 % — ABNORMAL LOW (ref 36.0–46.0)
Hemoglobin: 7.9 g/dL — ABNORMAL LOW (ref 12.0–15.0)
MCH: 25.6 pg — ABNORMAL LOW (ref 26.0–34.0)
MCHC: 32.5 g/dL (ref 30.0–36.0)
MCV: 78.9 fL — ABNORMAL LOW (ref 80.0–100.0)
Platelets: 179 10*3/uL (ref 150–400)
RBC: 3.08 MIL/uL — ABNORMAL LOW (ref 3.87–5.11)
RDW: 16 % — ABNORMAL HIGH (ref 11.5–15.5)
WBC: 10 10*3/uL (ref 4.0–10.5)
nRBC: 0 % (ref 0.0–0.2)

## 2021-11-30 LAB — CULTURE, BETA STREP (GROUP B ONLY): Strep Gp B Culture: NEGATIVE

## 2021-11-30 MED ORDER — TRANEXAMIC ACID-NACL 1000-0.7 MG/100ML-% IV SOLN
1000.0000 mg | INTRAVENOUS | Status: AC
Start: 2021-11-30 — End: 2021-11-30
  Administered 2021-11-30: 1000 mg via INTRAVENOUS

## 2021-11-30 MED ORDER — PHENYLEPHRINE 40 MCG/ML (10ML) SYRINGE FOR IV PUSH (FOR BLOOD PRESSURE SUPPORT)
80.0000 ug | PREFILLED_SYRINGE | INTRAVENOUS | Status: AC | PRN
  Administered 2021-11-30 (×3): 80 ug via INTRAVENOUS

## 2021-11-30 MED ORDER — COCONUT OIL OIL: 1.0000 "application " | TOPICAL_OIL | Status: DC | PRN

## 2021-11-30 MED ORDER — BENZOCAINE-MENTHOL 20-0.5 % EX AERO
1.0000 "application " | INHALATION_SPRAY | CUTANEOUS | Status: DC | PRN
  Administered 2021-11-30: 1 via TOPICAL
  Filled 2021-11-30: qty 56

## 2021-11-30 MED ORDER — WITCH HAZEL-GLYCERIN EX PADS: 1.0000 "application " | MEDICATED_PAD | CUTANEOUS | Status: DC | PRN

## 2021-11-30 MED ORDER — DIPHENHYDRAMINE HCL 50 MG/ML IJ SOLN: 12.5000 mg | INTRAMUSCULAR | Status: DC | PRN

## 2021-11-30 MED ORDER — PHENYLEPHRINE 40 MCG/ML (10ML) SYRINGE FOR IV PUSH (FOR BLOOD PRESSURE SUPPORT)
80.0000 ug | PREFILLED_SYRINGE | INTRAVENOUS | Status: AC | PRN
  Administered 2021-11-30 (×3): 80 ug via INTRAVENOUS
  Filled 2021-11-30 (×2): qty 10

## 2021-11-30 MED ORDER — EPHEDRINE 5 MG/ML INJ
10.0000 mg | INTRAVENOUS | Status: DC | PRN
  Administered 2021-11-30: 10 mg via INTRAVENOUS

## 2021-11-30 MED ORDER — ONDANSETRON HCL 4 MG PO TABS
4.0000 mg | ORAL_TABLET | ORAL | Status: DC | PRN
Start: 2021-11-30 — End: 2021-12-01

## 2021-11-30 MED ORDER — DIPHENHYDRAMINE HCL 25 MG PO CAPS: 25.0000 mg | ORAL_CAPSULE | Freq: Four times a day (QID) | ORAL | Status: DC | PRN

## 2021-11-30 MED ORDER — PRENATAL MULTIVITAMIN CH
1.0000 | ORAL_TABLET | Freq: Every day | ORAL | Status: DC
  Administered 2021-11-30 – 2021-12-01 (×2): 1 via ORAL
  Filled 2021-11-30 (×2): qty 1

## 2021-11-30 MED ORDER — ONDANSETRON HCL 4 MG/2ML IJ SOLN: 4.0000 mg | INTRAMUSCULAR | Status: DC | PRN

## 2021-11-30 MED ORDER — EPHEDRINE 5 MG/ML INJ
10.0000 mg | INTRAVENOUS | Status: DC | PRN
  Filled 2021-11-30: qty 5

## 2021-11-30 MED ORDER — FENTANYL-BUPIVACAINE-NACL 0.5-0.125-0.9 MG/250ML-% EP SOLN
12.0000 mL/h | EPIDURAL | Status: DC | PRN
  Administered 2021-11-30: 12 mL/h via EPIDURAL
  Filled 2021-11-30: qty 250

## 2021-11-30 MED ORDER — TETANUS-DIPHTH-ACELL PERTUSSIS 5-2.5-18.5 LF-MCG/0.5 IM SUSY: 0.5000 mL | PREFILLED_SYRINGE | Freq: Once | INTRAMUSCULAR | Status: DC

## 2021-11-30 MED ORDER — LACTATED RINGERS IV SOLN
500.0000 mL | Freq: Once | INTRAVENOUS | Status: AC
  Administered 2021-11-30: 500 mL via INTRAVENOUS

## 2021-11-30 MED ORDER — DIBUCAINE (PERIANAL) 1 % EX OINT: 1.0000 "application " | TOPICAL_OINTMENT | CUTANEOUS | Status: DC | PRN

## 2021-11-30 MED ORDER — TRANEXAMIC ACID-NACL 1000-0.7 MG/100ML-% IV SOLN
INTRAVENOUS | Status: AC
  Filled 2021-11-30: qty 100

## 2021-11-30 MED ORDER — IBUPROFEN 600 MG PO TABS
600.0000 mg | ORAL_TABLET | Freq: Four times a day (QID) | ORAL | Status: DC
  Administered 2021-11-30 – 2021-12-01 (×6): 600 mg via ORAL
  Filled 2021-11-30 (×6): qty 1

## 2021-11-30 MED ORDER — MEDROXYPROGESTERONE ACETATE 150 MG/ML IM SUSP: 150.0000 mg | INTRAMUSCULAR | Status: DC | PRN

## 2021-11-30 MED ORDER — SODIUM CHLORIDE 0.9 % IV SOLN
500.0000 mg | Freq: Once | INTRAVENOUS | Status: AC
  Administered 2021-11-30: 500 mg via INTRAVENOUS
  Filled 2021-11-30: qty 25

## 2021-11-30 MED ORDER — METHYLERGONOVINE MALEATE 0.2 MG/ML IJ SOLN
INTRAMUSCULAR | Status: AC
  Filled 2021-11-30: qty 1

## 2021-11-30 MED ORDER — ACETAMINOPHEN 325 MG PO TABS
650.0000 mg | ORAL_TABLET | ORAL | Status: DC | PRN
  Administered 2021-11-30 (×2): 650 mg via ORAL
  Filled 2021-11-30 (×2): qty 2

## 2021-11-30 MED ORDER — METHYLERGONOVINE MALEATE 0.2 MG/ML IJ SOLN
0.2000 mg | Freq: Once | INTRAMUSCULAR | Status: AC
  Administered 2021-11-30: 0.2 mg via INTRAMUSCULAR

## 2021-11-30 MED ORDER — ZOLPIDEM TARTRATE 5 MG PO TABS: 5.0000 mg | ORAL_TABLET | Freq: Every evening | ORAL | Status: DC | PRN

## 2021-11-30 MED ORDER — SIMETHICONE 80 MG PO CHEW: 80.0000 mg | CHEWABLE_TABLET | ORAL | Status: DC | PRN

## 2021-11-30 MED ORDER — LIDOCAINE HCL (PF) 1 % IJ SOLN
INTRAMUSCULAR | Status: DC | PRN
  Administered 2021-11-30 (×2): 5 mL via EPIDURAL

## 2021-11-30 MED ORDER — SENNOSIDES-DOCUSATE SODIUM 8.6-50 MG PO TABS
2.0000 | ORAL_TABLET | Freq: Every day | ORAL | Status: DC
  Administered 2021-12-01: 2 via ORAL
  Filled 2021-11-30: qty 2

## 2021-11-30 NOTE — Progress Notes (Signed)
Paula Noble is a 21 y.o. G1P0 at [redacted]w[redacted]d by ultrasound admitted for induction of labor due to BPP 2/8, decreased fetal movement. ? ?Subjective: ?Resting in bed currently tolerating contractions currently ? ?Objective: ?BP (!) 132/95   Pulse 92   Temp 98.9 ?F (37.2 ?C) (Oral)   Resp 18   Ht 5\' 3"  (1.6 m)   Wt 108.9 kg   LMP 12/27/2020 Comment: neg preg test  SpO2 99%   BMI 42.51 kg/m?  ?No intake/output data recorded. ?No intake/output data recorded. ? ?FHT: FHR 130s-140s, moderate varibility +accels, no decels ?SVE:   Dilation: 5.5 ?Effacement (%): 50, 60 ?Station: -1, -2 ?Exam by:: Dr. [email or web address] ? ?Labs: ?Lab Results  ?Component Value Date  ? WBC 6.2 11/29/2021  ? HGB 9.6 (L) 11/29/2021  ? HCT 30.7 (L) 11/29/2021  ? MCV 79.9 (L) 11/29/2021  ? PLT 215 11/29/2021  ? ? ?Assessment / Plan: ?Induction of labor due to BPP 2/8, decreased fetal movement.  ? ?Labor: Progressing s/p cytotec, foley balloon. Currently on pitocin started at 2024. AROM performed in room with clear fluid ?Fetal Wellbeing:  Category I ?Pain Control:  Plan for epidural  ?I/D:   GBS negative ? ?Elevated BP ?Elevated to 143/101 and 132/95. Asx will monitor closely, consider Pre-E labs if continues to be elevated ? ?Arney Mayabb ?11/30/2021, 12:14 AM ? ? ?

## 2021-11-30 NOTE — Anesthesia Procedure Notes (Signed)
Epidural ?Patient location during procedure: OB ?Start time: 11/30/2021 1:43 AM ?End time: 11/30/2021 1:50 AM ? ?Staffing ?Anesthesiologist: Mal Amabile, MD ?Performed: anesthesiologist  ? ?Preanesthetic Checklist ?Completed: patient identified, IV checked, site marked, risks and benefits discussed, surgical consent, monitors and equipment checked, pre-op evaluation and timeout performed ? ?Epidural ?Patient position: sitting ?Prep: DuraPrep and site prepped and draped ?Patient monitoring: continuous pulse ox and blood pressure ?Approach: midline ?Location: L3-L4 ?Injection technique: LOR air ? ?Needle:  ?Needle type: Tuohy  ?Needle gauge: 17 G ?Needle length: 9 cm and 9 ?Needle insertion depth: 7 cm ?Catheter type: closed end flexible ?Catheter size: 19 Gauge ?Catheter at skin depth: 12 cm ?Test dose: negative and Other ? ?Assessment ?Events: blood not aspirated, injection not painful, no injection resistance, no paresthesia and negative IV test ? ?Additional Notes ?Patient identified. Risks and benefits discussed including failed block, incomplete  ?Pain control, post dural puncture headache, nerve damage, paralysis, blood pressure ?Changes, nausea, vomiting, reactions to medications-both toxic and allergic and post ?Partum back pain. All questions were answered. Patient expressed understanding and wished to proceed. Sterile technique was used throughout procedure. Epidural site was ?Dressed with sterile barrier dressing. No paresthesias, signs of intravascular injection ?Or signs of intrathecal spread were encountered.  ?Patient was more comfortable after the epidural was dosed. ?Please see RN's note for documentation of vital signs and FHR which are stable. ?Reason for block:procedure for pain ? ? ? ?

## 2021-11-30 NOTE — Plan of Care (Signed)
?  Problem: Education: ?Goal: Knowledge of Childbirth will improve ?Outcome: Completed/Met ?Goal: Ability to make informed decisions regarding treatment and plan of care will improve ?Outcome: Completed/Met ?Goal: Ability to state and carry out methods to decrease the pain will improve ?Outcome: Completed/Met ?Goal: Individualized Educational Video(s) ?Outcome: Completed/Met ?  ?Problem: Coping: ?Goal: Ability to verbalize concerns and feelings about labor and delivery will improve ?Outcome: Completed/Met ?  ?Problem: Life Cycle: ?Goal: Ability to make normal progression through stages of labor will improve ?Outcome: Completed/Met ?Goal: Ability to effectively push during vaginal delivery will improve ?Outcome: Completed/Met ?  ?Problem: Role Relationship: ?Goal: Will demonstrate positive interactions with the child ?Outcome: Completed/Met ?  ?Problem: Safety: ?Goal: Risk of complications during labor and delivery will decrease ?Outcome: Completed/Met ?  ?Problem: Pain Management: ?Goal: Relief or control of pain from uterine contractions will improve ?Outcome: Completed/Met ?  ?

## 2021-11-30 NOTE — Lactation Note (Signed)
This note was copied from a baby's chart. ?Lactation Consultation Note ?Mom chooses to formula feed. ? ?Patient Name: Paula Noble ?Today's Date: 11/30/2021 ?  ?Age:21 hours ? ?Maternal Data ?  ? ?Feeding ?  ? ?LATCH Score ?  ? ?  ? ?  ? ?  ? ?  ? ?  ? ? ?Lactation Tools Discussed/Used ?  ? ?Interventions ?  ? ?Discharge ?  ? ?Consult Status ?Consult Status: Complete ? ? ? ?Charyl Dancer ?11/30/2021, 5:02 AM ? ? ? ?

## 2021-11-30 NOTE — Discharge Summary (Signed)
? ?  Postpartum Discharge Summary ? ?   ?Patient Name: Paula Noble ?DOB: 2001-03-11 ?MRN: [id number] ? ?Date of admission: 11/29/2021 ?Delivery date:11/30/2021  ?Delivering provider: Renard Matter  ?Date of discharge: 12/01/2021 ? ?Admitting diagnosis: Encounter for induction of labor [Z34.90] ?Intrauterine pregnancy: [redacted]w[redacted]d    ?Secondary diagnosis:  Principal Problem: ?  Encounter for induction of labor ?Active Problems: ?  Supervision of other normal pregnancy, antepartum ?  Rubella non-immune status, antepartum ?  Vaginal delivery ? ?Additional problems: Iron Deficiency Anemia   ?Discharge diagnosis: Term Pregnancy Delivered                                              ?Post partum procedures: Venofer infusion ?Augmentation: AROM, Pitocin, Cytotec, and IP Foley ?Complications: None ? ?Hospital course: Induction of Labor With Vaginal Delivery   ?21y.o. yo G1P0 at 380w4das admitted to the hospital 11/29/2021 for induction of labor.  Indication for induction:  non reassuring fetal status with BPP 2/8 .  Patient had an uncomplicated labor course as follows: ?Membrane Rupture Time/Date: 12:08 AM ,11/30/2021   ?Delivery Method:Vaginal, Spontaneous  ?Episiotomy: None  ?Lacerations:  2nd degree  ?Details of delivery can be found in separate delivery note.  Patient had a routine postpartum course. Patient is discharged home 12/01/21. ? ?Newborn Data: ?Birth date:11/30/2021  ?Birth time:4:21 AM  ?Gender:Female  ?Living status:Living  ?Apgars:9 ,9  ?Weight:3050 g  ? ?Magnesium Sulfate received: No ?BMZ received: No ?Rhophylac:N/A ?MMR:Yes ?T-DaP:Given prenatally ?Flu: No - declined prenatally ?Transfusion:No - Venofer ? ?Physical exam  ?Vitals:  ? 11/30/21 0842 11/30/21 1638 11/30/21 2035 12/01/21 0528  ?BP: (!) 100/59 109/64 107/65 (!) 112/54  ?Pulse: (!) 102 87 89 78  ?Resp: '17 17 18 18  ' ?Temp: 98 ?F (36.7 ?C) 97.6 ?F (36.4 ?C) 98 ?F (36.7 ?C) 97.9 ?F (36.6 ?C)  ?TempSrc: Oral Oral Oral Oral  ?SpO2: 100% 99% 98% 99%   ?Weight:      ?Height:      ? ?General: alert, cooperative, and no distress ?Lochia: appropriate ?Uterine Fundus: firm, U-1 ?Incision: N/A ?DVT Evaluation: No evidence of DVT seen on physical exam. ?Negative Homan's sign. ?No cords or calf tenderness. ?No significant calf/ankle edema. ?Labs: ?Lab Results  ?Component Value Date  ? WBC 10.0 11/30/2021  ? HGB 7.9 (L) 11/30/2021  ? HCT 24.3 (L) 11/30/2021  ? MCV 78.9 (L) 11/30/2021  ? PLT 179 11/30/2021  ? ?CMP Latest Ref Rng & Units 05/25/2021  ?Glucose 65 - 99 mg/dL 84  ?BUN 6 - 20 mg/dL 8  ?Creatinine 0.57 - 1.00 mg/dL 0.34(L)  ?Sodium 134 - 144 mmol/L 136  ?Potassium 3.5 - 5.2 mmol/L 4.1  ?Chloride 96 - 106 mmol/L 102  ?CO2 20 - 29 mmol/L 18(L)  ?Calcium 8.7 - 10.2 mg/dL 9.4  ?Total Protein 6.0 - 8.5 g/dL 6.6  ?Total Bilirubin 0.0 - 1.2 mg/dL <0.2  ?Alkaline Phos 42 - 106 IU/L 42  ?AST 0 - 40 IU/L 9  ?ALT 0 - 32 IU/L 8  ? ?Edinburgh Score: ?Edinburgh Postnatal Depression Scale Screening Tool 11/30/2021  ?I have been able to laugh and see the funny side of things. 0  ?I have looked forward with enjoyment to things. 0  ?I have blamed myself unnecessarily when things went wrong. 0  ?I have been anxious or worried for no good  reason. 0  ?I have felt scared or panicky for no good reason. 0  ?Things have been getting on top of me. 0  ?I have been so unhappy that I have had difficulty sleeping. 0  ?I have felt sad or miserable. 0  ?I have been so unhappy that I have been crying. 0  ?The thought of harming myself has occurred to me. 0  ?Edinburgh Postnatal Depression Scale Total 0  ? ? ? ?After visit meds:  ?Allergies as of 12/01/2021   ?No Known Allergies ?  ? ?  ?Medication List  ?  ? ?TAKE these medications   ? ?ascorbic acid 500 MG tablet ?Commonly known as: VITAMIN C ?Take 1 tablet (500 mg total) by mouth every other day. Take with iron pill ?  ?Blood Pressure Monitoring Devi ?1 each by Does not apply route once a week. ?  ?ferrous sulfate 325 (65 FE) MG EC tablet ?Take 1  tablet (325 mg total) by mouth every other day. ?  ?ibuprofen 600 MG tablet ?Commonly known as: ADVIL ?Take 1 tablet (600 mg total) by mouth every 6 (six) hours. ?  ?multivitamin-prenatal 27-0.8 MG Tabs tablet ?Take 1 tablet by mouth daily at 12 noon. ?  ? ?  ? ? ? ?Discharge home in stable condition ?Infant Feeding: Bottle ?Infant Disposition:home with mother ?Discharge instruction: per After Visit Summary and Postpartum booklet. ?Activity: Advance as tolerated. Pelvic rest for 6 weeks.  ?Diet: routine diet ?Future Appointments: ?Future Appointments  ?Date Time Provider [place]  ?01/11/2022  9:15 AM Luvenia Redden, PA-C [facility]  ? ?Follow up Visit: ? Follow-up Information   ? ? [facility] [employer] [facility] Go on 01/11/2022.   ?Specialty: Obstetrics and Gynecology ?Why: postpartum visit ?Contact information: ?[place] ?[place] [address] ?[phone] ? ?  ?  ? ?  ?  ? ?  ? ?Message sent to [facility] by Dr. Cy Blamer on 3/17 ? ?Please schedule this patient for a In person postpartum visit in 6 weeks with the following provider: Any provider. ?Additional Postpartum F/U: None   ?Low risk pregnancy complicated by:  non reassuring fetal testing at term ?Delivery mode:  Vaginal, Spontaneous  ?Anticipated Birth Control:  PP Nexplanon placed ? ? ?12/01/2021 ?Laury Deep, CNM ? ? ? ?

## 2021-11-30 NOTE — Plan of Care (Signed)

## 2021-11-30 NOTE — Anesthesia Preprocedure Evaluation (Signed)
Anesthesia Evaluation  ?Patient identified by MRN, date of birth, ID band ?Patient awake ? ? ? ?Reviewed: ?Allergy & Precautions, Patient's Chart, lab work & pertinent test results ? ?Airway ?Mallampati: II ? ?TM Distance: >3 FB ?Neck ROM: Full ? ? ? Dental ?no notable dental hx. ? ?  ?Pulmonary ?former smoker,  ?  ?Pulmonary exam normal ? ? ? ? ? ? ? Cardiovascular ?negative cardio ROS ?Normal cardiovascular exam ? ? ?  ?Neuro/Psych ?negative neurological ROS ? negative psych ROS  ? GI/Hepatic ?Neg liver ROS, GERD  ,  ?Endo/Other  ?Morbid obesity ? Renal/GU ?negative Renal ROS  ?negative genitourinary ?  ?Musculoskeletal ?negative musculoskeletal ROS ?(+)  ? Abdominal ?(+) + obese,   ?Peds ? Hematology ? ?(+) Blood dyscrasia, anemia ,   ?Anesthesia Other Findings ? ? Reproductive/Obstetrics ?(+) Pregnancy ? ?  ? ? ? ? ? ? ? ? ? ? ? ? ? ?  ?  ? ? ? ? ? ? ? ? ?Anesthesia Physical ?Anesthesia Plan ? ?ASA: 3 ? ?Anesthesia Plan: Epidural  ? ?Post-op Pain Management:   ? ?Induction:  ? ?PONV Risk Score and Plan:  ? ?Airway Management Planned: Natural Airway ? ?Additional Equipment:  ? ?Intra-op Plan:  ? ?Post-operative Plan:  ? ?Informed Consent: I have reviewed the patients History and Physical, chart, labs and discussed the procedure including the risks, benefits and alternatives for the proposed anesthesia with the patient or authorized representative who has indicated his/her understanding and acceptance.  ? ? ? ? ? ?Plan Discussed with: Anesthesiologist ? ?Anesthesia Plan Comments:   ? ? ? ? ? ? ?Anesthesia Quick Evaluation ? ?

## 2021-11-30 NOTE — Anesthesia Postprocedure Evaluation (Signed)
Anesthesia Post Note ? ?Patient: Paula Noble ? ?Procedure(s) Performed: AN AD HOC LABOR EPIDURAL ? ?  ? ?Patient location during evaluation: Mother Baby ?Anesthesia Type: Epidural ?Level of consciousness: awake and alert ?Pain management: pain level controlled ?Vital Signs Assessment: post-procedure vital signs reviewed and stable ?Respiratory status: spontaneous breathing, nonlabored ventilation and respiratory function stable ?Cardiovascular status: stable ?Postop Assessment: no headache, no backache, epidural receding, no apparent nausea or vomiting, patient able to bend at knees, adequate PO intake and able to ambulate ?Anesthetic complications: no ? ? ?No notable events documented. ? ?Last Vitals:  ?Vitals:  ? 11/30/21 0649 11/30/21 0842  ?BP: 130/76 (!) 100/59  ?Pulse: 97 (!) 102  ?Resp: 16 17  ?Temp: 36.8 ?C 36.7 ?C  ?SpO2: 100% 100%  ?  ?Last Pain:  ?Vitals:  ? 11/30/21 1239  ?TempSrc:   ?PainSc: 2   ? ?Pain Goal:   ? ?  ?  ?  ?  ?  ?  ?  ? ?[name] ? ? ? ? ?

## 2021-12-01 DIAGNOSIS — Z30017 Encounter for initial prescription of implantable subdermal contraceptive: Secondary | ICD-10-CM | POA: Diagnosis not present

## 2021-12-01 MED ORDER — ASCORBIC ACID 500 MG PO TABS: 500.0000 mg | ORAL_TABLET | ORAL | 3 refills | Status: DC

## 2021-12-01 MED ORDER — ETONOGESTREL 68 MG ~~LOC~~ IMPL
68.0000 mg | DRUG_IMPLANT | Freq: Once | SUBCUTANEOUS | Status: AC
  Administered 2021-12-01: 68 mg via SUBCUTANEOUS
  Filled 2021-12-01: qty 1

## 2021-12-01 MED ORDER — FERROUS SULFATE 325 (65 FE) MG PO TBEC: 325.0000 mg | DELAYED_RELEASE_TABLET | ORAL | 2 refills | Status: DC

## 2021-12-01 MED ORDER — LIDOCAINE HCL 1 % IJ SOLN
0.0000 mL | Freq: Once | INTRAMUSCULAR | Status: AC | PRN
  Administered 2021-12-01: 3 mL via INTRADERMAL
  Filled 2021-12-01: qty 20

## 2021-12-01 MED ORDER — IBUPROFEN 600 MG PO TABS: 600.0000 mg | ORAL_TABLET | Freq: Four times a day (QID) | ORAL | 0 refills | Status: DC

## 2021-12-01 MED ORDER — MEASLES, MUMPS & RUBELLA VAC IJ SOLR
0.5000 mL | Freq: Once | INTRAMUSCULAR | Status: AC
  Administered 2021-12-01: 0.5 mL via SUBCUTANEOUS
  Filled 2021-12-01: qty 0.5

## 2021-12-01 NOTE — Procedures (Signed)
NEXPLANON PROCEDURE NOTE ?  ?Ms. Paula Noble is a 21 y.o. G1P1001 desires Nexplanon insertion prior to discharge home today. No complaints. All questions answered. ? ?BP (!) 112/54 (BP Location: Left Arm)   Pulse 78   Temp 97.9 ?F (36.6 ?C) (Oral)   Resp 18   Ht 5\' 3"  (1.6 m)   Wt 108.9 kg   LMP 12/27/2020 Comment: neg preg test  SpO2 99%   Breastfeeding Unknown   BMI 42.51 kg/m?   ? ?Results for orders placed or performed during the hospital encounter of 11/29/21 (from the past 24 hour(s))  ?CBC     Status: Abnormal  ? Collection Time: 11/30/21 11:24 AM  ?Result Value Ref Range  ? WBC 10.0 4.0 - 10.5 K/uL  ? RBC 3.08 (L) 3.87 - 5.11 MIL/uL  ? Hemoglobin 7.9 (L) 12.0 - 15.0 g/dL  ? HCT 24.3 (L) 36.0 - 46.0 %  ? MCV 78.9 (L) 80.0 - 100.0 fL  ? MCH 25.6 (L) 26.0 - 34.0 pg  ? MCHC 32.5 30.0 - 36.0 g/dL  ? RDW 16.0 (H) 11.5 - 15.5 %  ? Platelets 179 150 - 400 K/uL  ? nRBC 0.0 0.0 - 0.2 %  ?  ?  ?Nexplanon Insertion Procedure ?Patient identified, informed consent performed, consent signed.   Patient does understand that irregular bleeding is a very common side effect of this medication. She was advised to have backup contraception for one week after placement. Pregnancy test in clinic today was negative.  Appropriate time out taken @ 0957.  Patient's left arm was prepped and draped in the usual sterile fashion. The ruler used to measure and mark insertion area.  Patient was prepped with alcohol swab and then injected with 2 ml of 1% lidocaine.  She was prepped with betadine, Nexplanon removed from packaging,  Device confirmed in needle, then inserted full length of needle and withdrawn per handbook instructions. Nexplanon was able to palpated in the patient's arm; patient palpated the insert herself. There was minimal blood loss.  Patient insertion site covered with guaze and a pressure bandage to reduce any bruising.  The patient tolerated the procedure well and was given post procedure instructions.  Follow-up via My Chart video visit, unless having problems and need to be seen in the office. ? ?Nexplanon Lot#: 12/02/21 / Expiration Date: 12/22/2022 ? ?02/21/2023, CNM  ?12/01/2021 10:16 AM  ? ?

## 2021-12-03 ENCOUNTER — Telehealth: Payer: Self-pay

## 2021-12-03 ENCOUNTER — Encounter: Payer: Medicaid Other | Admitting: Family Medicine

## 2021-12-03 NOTE — Telephone Encounter (Signed)
Transition Care Management Unsuccessful Follow-up Telephone Call ? ?Date of discharge and from where:  12/01/2021-[facility]  ? ?Attempts:  1st Attempt ? ?Reason for unsuccessful TCM follow-up call:  Left voice message ? ?  ?

## 2021-12-04 NOTE — Telephone Encounter (Signed)
Transition Care Management Unsuccessful Follow-up Telephone Call ? ?Date of discharge and from where:  12/01/2021-[facility] ? ?Attempts:  2nd Attempt ? ?Reason for unsuccessful TCM follow-up call:  Left voice message ? ?  ?

## 2021-12-05 NOTE — Telephone Encounter (Signed)
Transition Care Management Unsuccessful Follow-up Telephone Call ? ?Date of discharge and from where:  12/01/2021-[facility] ? ?Attempts:  3rd Attempt ? ?Reason for unsuccessful TCM follow-up call:  Left voice message ? ?  ?

## 2021-12-06 ENCOUNTER — Ambulatory Visit: Payer: Medicaid Other

## 2021-12-10 ENCOUNTER — Encounter: Payer: Self-pay | Admitting: Obstetrics and Gynecology

## 2021-12-11 ENCOUNTER — Telehealth (HOSPITAL_COMMUNITY): Payer: Self-pay | Admitting: *Deleted

## 2021-12-11 NOTE — Telephone Encounter (Signed)
Left phone voicemail message. ? ?Duffy Rhody, RN 12-11-2021 at 10:17am ?

## 2021-12-12 ENCOUNTER — Encounter: Payer: Self-pay | Admitting: Certified Nurse Midwife

## 2021-12-14 ENCOUNTER — Inpatient Hospital Stay (HOSPITAL_COMMUNITY): Admit: 2021-12-14 | Payer: Self-pay

## 2021-12-17 ENCOUNTER — Encounter: Payer: Self-pay | Admitting: Medical

## 2021-12-21 ENCOUNTER — Encounter: Payer: Self-pay | Admitting: Certified Nurse Midwife

## 2021-12-24 ENCOUNTER — Encounter: Payer: Self-pay | Admitting: Family Medicine

## 2021-12-24 ENCOUNTER — Other Ambulatory Visit: Payer: Self-pay

## 2022-01-11 ENCOUNTER — Ambulatory Visit: Payer: Medicaid Other | Admitting: Medical

## 2022-01-21 ENCOUNTER — Encounter: Payer: Self-pay | Admitting: Certified Nurse Midwife

## 2022-01-23 ENCOUNTER — Encounter: Payer: Self-pay | Admitting: Certified Nurse Midwife

## 2022-01-23 ENCOUNTER — Ambulatory Visit (INDEPENDENT_AMBULATORY_CARE_PROVIDER_SITE_OTHER): Payer: Medicaid Other | Admitting: Certified Nurse Midwife

## 2022-01-23 DIAGNOSIS — Z3046 Encounter for surveillance of implantable subdermal contraceptive: Secondary | ICD-10-CM | POA: Diagnosis not present

## 2022-01-23 NOTE — Progress Notes (Signed)
? ? ?Post Partum Visit Note ? ?Paula Noble is a 21 y.o. G24P1001 female who presents for a postpartum visit. She is 7 weeks 5 days postpartum following a normal spontaneous vaginal delivery.  I have fully reviewed the prenatal and intrapartum course. The delivery was at 37w 4d.  Anesthesia: epidural. Postpartum course has been uncomplicated. Baby is doing well. Baby is feeding by bottle Rush Barer . Bleeding is light. Bowel function is normal. Bladder function is normal. Patient is not sexually active. Contraception method is Nexplanon, which she requests be removed due to undesired side effects (nausea and continued bleeding). Does not want anything else for birth control as she is no longer involved with the FOB. Postpartum depression screening: negative. Pap is needed, but she prefers to wait until she's done bleeding, will reschedule in one month. Having some mild perineal discomfort, wants it assessed. ? ?Health Maintenance Due  ?Topic Date Due  ? COVID-19 Vaccine (1) Never done  ? HPV VACCINES (2 - 3-dose series) 03/26/2017  ? PAP-Cervical Cytology Screening  Never done  ? PAP SMEAR-Modifier  Never done  ? ?The following portions of the patient's history were reviewed and updated as appropriate: allergies, current medications, past family history, past medical history, past social history, past surgical history, and problem list. ? ?Review of Systems ?Pertinent items noted in HPI and remainder of comprehensive ROS otherwise negative. ? ?Objective:  ?BP 119/75   Pulse 66   Wt 216 lb 14.4 oz (98.4 kg)   Breastfeeding No   BMI 38.42 kg/m?   ? ?Constitutional: Alert, oriented female in no physical distress.  ?HEENT: PERRLA ?Skin: normal color and turgor, no rash ?Cardiovascular: normal rate & rhythm ?Respiratory: normal effort, no problems with respiration noted ?GI: Abd soft, non-tender ?MS: Extremities nontender, no edema, normal ROM ?Neurologic: Alert and oriented x 4.  ?GU: no CVA  tenderness ?Pelvic: perineum healing well, one area of granulation tissue noted on the right side of the vaginal floor. Silver nitrate applied. Scant blood noted.  ? ?Nexplanon Removal ?Patient was given informed consent for removal of her Nexplanon.  Appropriate time out taken. Nexplanon site identified.  Area prepped in usual sterile fashon. One ml of 1% lidocaine was used to anesthetize the area at the distal end of the implant. A small stab incision was made right beside the implant on the distal portion.  The Nexplanon rod was grasped using hemostats and removed without difficulty.  There was minimal blood loss. There were no complications.  Steri-strips were applied over the small incision.  A pressure bandage was applied to reduce any bruising.  The patient tolerated the procedure well and was given post procedure instructions.  Patient is planning to use abstinence (or condoms) for contraception. ? ?Assessment:  ?Postpartum care and examination ? ?Nexplanon removal  ? ?Plan:  ? ?Essential components of care per ACOG recommendations: ? ?1.  Mood and well being: Patient with negative depression screening today. Reviewed local resources for support.  ?- Patient tobacco use? No.   ?- hx of drug use? No.   ? ?2. Infant care and feeding:  ?-Patient currently breastmilk feeding? Yes. Reviewed importance of draining breast regularly to support lactation.  ?-Social determinants of health (SDOH) reviewed in EPIC. No concerns ? ?3. Sexuality, contraception and birth spacing ?- Patient does not want a pregnancy in the next year.  Desired family size is 1 children.  ?- Reviewed reproductive life planning. Reviewed contraceptive methods based on pt preferences and effectiveness.  Patient desired Abstinence and Female Condom today.   ?- Discussed birth spacing of 18 months ? ?4. Sleep and fatigue ?-Encouraged family/partner/community support of 4 hrs of uninterrupted sleep to help with mood and fatigue ? ?5. Physical  Recovery  ?- Discussed patients delivery and complications. She describes her labor as good. ?- Patient had a Vaginal, no problems at delivery. Patient had a 2nd degree laceration. Perineal healing reviewed. Patient expressed understanding ?- Patient has urinary incontinence? No. ?- Patient is safe to resume physical and sexual activity ? ?6.  Health Maintenance ?- HM due items addressed Yes ?- Pap smear not done at today's visit. Scheduled for 4 weeks. ?-Breast Cancer screening indicated? No.  ? ?7. Chronic Disease/Pregnancy Condition follow up: None ?- PCP follow up ? ?Bernerd Limbo, CNM ?[facility] [employer], [facility] ? ?

## 2022-02-13 ENCOUNTER — Ambulatory Visit: Payer: Medicaid Other | Admitting: Certified Nurse Midwife

## 2022-02-19 ENCOUNTER — Encounter: Payer: Self-pay | Admitting: *Deleted

## 2022-03-20 ENCOUNTER — Ambulatory Visit: Payer: Medicaid Other | Admitting: Certified Nurse Midwife

## 2022-05-29 ENCOUNTER — Encounter: Payer: Self-pay | Admitting: Certified Nurse Midwife

## 2022-05-29 ENCOUNTER — Other Ambulatory Visit: Payer: Self-pay

## 2022-05-29 ENCOUNTER — Ambulatory Visit (INDEPENDENT_AMBULATORY_CARE_PROVIDER_SITE_OTHER): Payer: Medicaid Other | Admitting: Certified Nurse Midwife

## 2022-05-29 ENCOUNTER — Other Ambulatory Visit (HOSPITAL_COMMUNITY)
Admission: RE | Admit: 2022-05-29 | Discharge: 2022-05-29 | Disposition: A | Discharge status: Home / Self Care | Payer: Medicaid Other | Source: Ambulatory Visit | Attending: Certified Nurse Midwife | Admitting: Certified Nurse Midwife

## 2022-05-29 VITALS — BP 111/70 | HR 73 | Ht 64.0 in | Wt 237.0 lb

## 2022-05-29 DIAGNOSIS — N760 Acute vaginitis: Secondary | ICD-10-CM | POA: Diagnosis not present

## 2022-05-29 DIAGNOSIS — Z01419 Encounter for gynecological examination (general) (routine) without abnormal findings: Secondary | ICD-10-CM | POA: Insufficient documentation

## 2022-05-29 DIAGNOSIS — Z0000 Encounter for general adult medical examination without abnormal findings: Secondary | ICD-10-CM

## 2022-05-29 DIAGNOSIS — N898 Other specified noninflammatory disorders of vagina: Secondary | ICD-10-CM | POA: Insufficient documentation

## 2022-05-29 DIAGNOSIS — Z3042 Encounter for surveillance of injectable contraceptive: Secondary | ICD-10-CM

## 2022-05-29 DIAGNOSIS — B9689 Other specified bacterial agents as the cause of diseases classified elsewhere: Secondary | ICD-10-CM

## 2022-05-29 DIAGNOSIS — A749 Chlamydial infection, unspecified: Secondary | ICD-10-CM | POA: Diagnosis not present

## 2022-05-29 DIAGNOSIS — Z3202 Encounter for pregnancy test, result negative: Secondary | ICD-10-CM

## 2022-05-29 LAB — POCT PREGNANCY, URINE: Preg Test, Ur: NEGATIVE

## 2022-05-29 MED ORDER — MEDROXYPROGESTERONE ACETATE 150 MG/ML IM SUSP
150.0000 mg | Freq: Once | INTRAMUSCULAR | Status: AC
  Administered 2022-05-29: 150 mg via INTRAMUSCULAR

## 2022-05-30 ENCOUNTER — Encounter: Payer: Self-pay | Admitting: Certified Nurse Midwife

## 2022-05-30 LAB — CERVICOVAGINAL ANCILLARY ONLY
Bacterial Vaginitis (gardnerella): POSITIVE — AB
Candida Glabrata: NEGATIVE
Candida Vaginitis: NEGATIVE
Chlamydia: POSITIVE — AB
Comment: NEGATIVE
Comment: NEGATIVE
Comment: NEGATIVE
Comment: NEGATIVE
Comment: NEGATIVE
Comment: NORMAL
Neisseria Gonorrhea: NEGATIVE
Trichomonas: NEGATIVE

## 2022-05-30 MED ORDER — METRONIDAZOLE 0.75 % VA GEL: 1.0000 | Freq: Every day | VAGINAL | 1 refills | Status: DC

## 2022-05-30 MED ORDER — AZITHROMYCIN 500 MG PO TABS: 1000.0000 mg | ORAL_TABLET | Freq: Once | ORAL | 0 refills | Status: AC

## 2022-05-30 NOTE — Progress Notes (Signed)
ANNUAL EXAM Patient name: Paula Noble MRN [id number]  Date of birth: 15-Apr-2001  Chief Complaint:   Gynecologic Exam  History of Present Illness:   Paula Noble is a 21 y.o. G53P1001 Hispanic female being seen today for a routine annual exam.  Current complaints: vaginitis  The pregnancy intention screening data noted above was reviewed. Potential methods of contraception were discussed. The patient elected to proceed with Hormonal Injection (Counseled that since she does not like having contraception in place if she is not in a relationship, she try Depo until she is sure this relationship will last so she does not have multiple surgical insertions/removals.)  Last pap Never (age).  Last mammogram: Never (age). Results were: N/A. Family h/o breast cancer: no Last colonoscopy: Never (age). Results were: N/A. Family h/o colorectal cancer: no     05/29/2022    9:52 AM 10/24/2021   11:47 AM 10/10/2021    4:04 PM 09/26/2021   11:40 AM 09/12/2021    8:58 AM  Depression screen PHQ 2/9  Decreased Interest 0 1 1 1 1   Down, Depressed, Hopeless 0 1 1 1 1   PHQ - 2 Score 0 2 2 2 2   Altered sleeping 0 1 1 1 1   Tired, decreased energy 0 1 1 1 1   Change in appetite 0 1 0 0 0  Feeling bad or failure about yourself  0 1 1 1 1   Trouble concentrating 0 1 1 1 1   Moving slowly or fidgety/restless 0 0 0 0 0  Suicidal thoughts 0 0 0 0 0  PHQ-9 Score 0 7 6 6 6         05/29/2022    9:52 AM   11:47 AM 10/10/2021    4:04 PM 09/26/2021   11:41 AM  GAD 7 : Generalized Anxiety Score  Nervous, Anxious, on Edge 0 1 1 1   Control/stop worrying 0 1 1 1   Worry too much - different things 0 1 1 1   Trouble relaxing 0 0 0 0  Restless 0 0 0 0  Easily annoyed or irritable 0 1 0 1  Afraid - awful might happen 0 1 1 1   Total GAD 7 Score 0 5 4 5      Review of Systems:   Pertinent items are noted in HPI Denies any headaches, blurred vision, fatigue, shortness of breath, chest pain,  abdominal pain, abnormal vaginal discharge/itching/odor/irritation, problems with periods, bowel movements, urination, or intercourse unless otherwise stated above. Pertinent History Reviewed:  Reviewed past medical,surgical, social and family history.  Reviewed problem list, medications and allergies. Physical Assessment:   Vitals:   05/29/22 0951  BP: 111/70  Pulse: 73  Weight: 237 lb (107.5 kg)  Height: 5\' 4"  (1.626 m)   Body mass index is 40.68 kg/m.   Physical Examination:  General appearance - well appearing, and in no distress Mental status - alert, oriented to person, place, and time Psych:  She has a normal mood and affect Skin - warm and dry Chest - effort normal Heart - normal rate and regular rhythm Neck:  midline trachea, no thyromegaly or nodules Breasts - breasts appear normal Abdomen - soft, nontender Pelvic - VULVA: normal appearing vulva with no masses, tenderness or lesions  VAGINA: normal appearing vagina with normal color and discharge, no lesions  CERVIX: normal appearing cervix without discharge or lesions, no CMT Thin prep pap is done with HR HPV cotesting Extremities:  No swelling or varicosities noted  Chaperone present  for exam  No results found for this or any previous visit (from the past 24 hour(s)).  Assessment & Plan:  1. Encounter for annual routine gynecological examination - Cytology - PAP( [facility])  2. Vaginal discharge - Cervicovaginal ancillary only( )  3. Encounter for Depo-Provera contraception - Pt counseled heavily regarding contraceptive methods. Had a Nexplanon inserted prior to postpartum discharge then requested it be removed at her postpartum follow up. Now asking for it to be re-inserted. Counseled that since she does not like having contraception on board if she is not sexually active, she should try Depo until this relationship has proved long-term/stable or she is sure she will be continuously sexually active.  Pt agreed to plan. - medroxyPROGESTERone (DEPO-PROVERA) injection 150 mg  4. Bacterial vaginosis - metroNIDAZOLE (METROGEL) 0.75 % vaginal gel; Place 1 Applicatorful vaginally at bedtime. Apply one applicatorful to vagina at bedtime for 5 days  Dispense: 70 g; Refill: 1  5. Chlamydia - azithromycin (ZITHROMAX) 500 MG tablet; Take 2 tablets (1,000 mg total) by mouth once for 1 dose.  Dispense: 2 tablet; Refill: 0  Mammogram: @ 21yo, or sooner if problems Colonoscopy: @ 21yo, or sooner if problems  Orders Placed This Encounter  Procedures   Pregnancy, urine POC   Meds:  Meds ordered this encounter  Medications   medroxyPROGESTERone (DEPO-PROVERA) injection 150 mg   azithromycin (ZITHROMAX) 500 MG tablet    Sig: Take 2 tablets (1,000 mg total) by mouth once for 1 dose.    Dispense:  2 tablet    Refill:  0    Order Specific Question:   Supervising Provider    Answer:   Reva Bores [[id number]]   metroNIDAZOLE (METROGEL) 0.75 % vaginal gel    Sig: Place 1 Applicatorful vaginally at bedtime. Apply one applicatorful to vagina at bedtime for 5 days    Dispense:  70 g    Refill:  1    Order Specific Question:   Supervising Provider    Answer:   Reva Bores [[id number]]   Follow-up: Return in about 3 months (around 08/28/2022) for IN-PERSON, DEPO.  Bernerd Limbo, CNM 05/30/2022 2:00 PM

## 2022-06-03 LAB — CYTOLOGY - PAP
Comment: NEGATIVE
Diagnosis: NEGATIVE
High risk HPV: NEGATIVE

## 2022-08-28 ENCOUNTER — Ambulatory Visit (INDEPENDENT_AMBULATORY_CARE_PROVIDER_SITE_OTHER): Payer: Medicaid Other

## 2022-08-28 DIAGNOSIS — Z3042 Encounter for surveillance of injectable contraceptive: Secondary | ICD-10-CM

## 2022-08-28 MED ORDER — MEDROXYPROGESTERONE ACETATE 150 MG/ML IM SUSP
150.0000 mg | Freq: Once | INTRAMUSCULAR | Status: AC
  Administered 2022-08-28: 150 mg via INTRAMUSCULAR

## 2022-08-28 NOTE — Progress Notes (Signed)
Paula Noble here for Depo-Provera Injection. Injection administered without complication. Patient will return in 3 months for next injection between 02/28 and 03/14. Next annual visit due September 2024.   Christella Scheuermann, RN 08/28/2022  1:38 PM

## 2022-11-03 ENCOUNTER — Encounter: Payer: Self-pay | Admitting: Certified Nurse Midwife

## 2022-11-11 ENCOUNTER — Other Ambulatory Visit: Payer: Self-pay

## 2022-11-11 ENCOUNTER — Emergency Department (HOSPITAL_COMMUNITY): Payer: Medicaid Other

## 2022-11-11 ENCOUNTER — Emergency Department (HOSPITAL_COMMUNITY)
Admission: EM | Admit: 2022-11-11 | Discharge: 2022-11-11 | Disposition: A | Discharge status: Home / Self Care | Payer: Medicaid Other | Attending: Emergency Medicine | Admitting: Emergency Medicine

## 2022-11-11 ENCOUNTER — Encounter (HOSPITAL_COMMUNITY): Payer: Self-pay | Admitting: Emergency Medicine

## 2022-11-11 DIAGNOSIS — R1031 Right lower quadrant pain: Secondary | ICD-10-CM | POA: Diagnosis present

## 2022-11-11 DIAGNOSIS — N83201 Unspecified ovarian cyst, right side: Secondary | ICD-10-CM | POA: Insufficient documentation

## 2022-11-11 DIAGNOSIS — N83202 Unspecified ovarian cyst, left side: Secondary | ICD-10-CM | POA: Insufficient documentation

## 2022-11-11 DIAGNOSIS — K529 Noninfective gastroenteritis and colitis, unspecified: Secondary | ICD-10-CM | POA: Diagnosis not present

## 2022-11-11 DIAGNOSIS — N838 Other noninflammatory disorders of ovary, fallopian tube and broad ligament: Secondary | ICD-10-CM | POA: Diagnosis not present

## 2022-11-11 DIAGNOSIS — R109 Unspecified abdominal pain: Secondary | ICD-10-CM | POA: Diagnosis not present

## 2022-11-11 LAB — COMPREHENSIVE METABOLIC PANEL
ALT: 13 U/L (ref 0–44)
AST: 16 U/L (ref 15–41)
Albumin: 4.6 g/dL (ref 3.5–5.0)
Alkaline Phosphatase: 52 U/L (ref 38–126)
Anion gap: 7 (ref 5–15)
BUN: 9 mg/dL (ref 6–20)
CO2: 22 mmol/L (ref 22–32)
Calcium: 9.1 mg/dL (ref 8.9–10.3)
Chloride: 106 mmol/L (ref 98–111)
Creatinine, Ser: 0.47 mg/dL (ref 0.44–1.00)
GFR, Estimated: >60 mL/min (ref >60)
Glucose, Bld: 97 mg/dL (ref 70–99)
Potassium: 3.9 mmol/L (ref 3.5–5.1)
Sodium: 135 mmol/L (ref 135–145)
Total Bilirubin: 0.6 mg/dL (ref 0.3–1.2)
Total Protein: 8.3 g/dL — ABNORMAL HIGH (ref 6.5–8.1)

## 2022-11-11 LAB — CBC WITH DIFFERENTIAL/PLATELET
Abs Immature Granulocytes: 0.06 10*3/uL (ref 0.00–0.07)
Basophils Absolute: 0 10*3/uL (ref 0.0–0.1)
Basophils Relative: 0 %
Eosinophils Absolute: 0 10*3/uL (ref 0.0–0.5)
Eosinophils Relative: 0 %
HCT: 39.8 % (ref 36.0–46.0)
Hemoglobin: 12.4 g/dL (ref 12.0–15.0)
Immature Granulocytes: 1 %
Lymphocytes Relative: 11 %
Lymphs Abs: 1.2 10*3/uL (ref 0.7–4.0)
MCH: 25.8 pg — ABNORMAL LOW (ref 26.0–34.0)
MCHC: 31.2 g/dL (ref 30.0–36.0)
MCV: 82.7 fL (ref 80.0–100.0)
Monocytes Absolute: 0.5 10*3/uL (ref 0.1–1.0)
Monocytes Relative: 5 %
Neutro Abs: 8.6 10*3/uL — ABNORMAL HIGH (ref 1.7–7.7)
Neutrophils Relative %: 83 %
Platelets: 223 10*3/uL (ref 150–400)
RBC: 4.81 MIL/uL (ref 3.87–5.11)
RDW: 15 % (ref 11.5–15.5)
WBC: 10.3 10*3/uL (ref 4.0–10.5)
nRBC: 0 % (ref 0.0–0.2)

## 2022-11-11 LAB — URINALYSIS, ROUTINE W REFLEX MICROSCOPIC
Bilirubin Urine: NEGATIVE
Glucose, UA: NEGATIVE mg/dL
Ketones, ur: NEGATIVE mg/dL
Nitrite: NEGATIVE
Protein, ur: NEGATIVE mg/dL
Specific Gravity, Urine: 1.025 (ref 1.005–1.030)
pH: 5 (ref 5.0–8.0)

## 2022-11-11 LAB — I-STAT BETA HCG BLOOD, ED (MC, WL, AP ONLY): I-stat hCG, quantitative: <5 m[IU]/mL (ref <5)

## 2022-11-11 LAB — LIPASE, BLOOD: Lipase: 36 U/L (ref 11–51)

## 2022-11-11 MED ORDER — IOHEXOL 300 MG/ML  SOLN
100.0000 mL | Freq: Once | INTRAMUSCULAR | Status: AC | PRN
  Administered 2022-11-11: 100 mL via INTRAVENOUS

## 2022-11-11 MED ORDER — KETOROLAC TROMETHAMINE 15 MG/ML IJ SOLN
15.0000 mg | Freq: Once | INTRAMUSCULAR | Status: AC
  Administered 2022-11-11: 15 mg via INTRAVENOUS
  Filled 2022-11-11: qty 1

## 2022-11-11 MED ORDER — ONDANSETRON 4 MG PO TBDP: 4.0000 mg | ORAL_TABLET | Freq: Three times a day (TID) | ORAL | 0 refills | Status: DC | PRN

## 2022-11-11 MED ORDER — SODIUM CHLORIDE (PF) 0.9 % IJ SOLN
INTRAMUSCULAR | Status: AC
  Filled 2022-11-11: qty 50

## 2022-11-11 MED ORDER — AMOXICILLIN-POT CLAVULANATE 875-125 MG PO TABS: 1.0000 | ORAL_TABLET | Freq: Two times a day (BID) | ORAL | 0 refills | Status: DC

## 2022-11-11 NOTE — Discharge Instructions (Signed)
As we discussed, CT imaging of your abdomen revealed that you have colitis.  This is basically just inflammation in the walls of your intestinal tract which could be infectious or inflammatory.  I have given you a prescription for antibiotics for you to take as prescribed as entirety for management of any infectious causes.  I have also given you Zofran for any residual nausea or vomiting.   Additionally, ultrasound imaging of your ovaries showed findings consistent with something known as PCOS.  You will need to follow-up with a primary care doctor for continued evaluation and management of this as well as follow-up of your visit today.  Return if development of any new or worsening symptoms.

## 2022-11-11 NOTE — ED Provider Notes (Cosign Needed Addendum)
[facility] Provider Note   CSN: [id number] Arrival date & time: 11/11/22  1204     History  Chief Complaint  Patient presents with   Abdominal Pain    Paula Noble is a 22 y.o. female.  Patient with no pertinent past medical history presents today with complaints of abdominal pain. She states that same began last night around 10 pm when she was getting ready for bed. Pain is sharp in her RLQ and radiates throughout her abdomen. The pain waxes and wanes without discernable trigger. She states that the pain can be severe and comes with nausea and diarrhea. She denies any history of similar symptoms previously. She is not sexually active. Denies hematuria, dysuria, or vaginal discharge. No history of abdominal surgeries. She is currently on her menstrual cycle.  The history is provided by the patient. No language interpreter was used.  Abdominal Pain Associated symptoms: diarrhea and nausea        Home Medications Prior to Admission medications   Medication Sig Start Date End Date Taking? Authorizing Provider  metroNIDAZOLE (METROGEL) 0.75 % vaginal gel Place 1 Applicatorful vaginally at bedtime. Apply one applicatorful to vagina at bedtime for 5 days Patient not taking: Reported on 08/28/2022 05/30/22   Gabriel Carina, CNM  Prenatal Vit-Fe Fumarate-FA (MULTIVITAMIN-PRENATAL) 27-0.8 MG TABS tablet Take 1 tablet by mouth daily at 12 noon. Patient not taking: Reported on 05/29/2022    [provider]      Allergies    Patient has no known allergies.    Review of Systems   Review of Systems  Gastrointestinal:  Positive for abdominal pain, diarrhea and nausea.  All other systems reviewed and are negative.   Physical Exam Updated Vital Signs BP (!) 147/96 (BP Location: Right Arm)   Pulse 91   Temp 98.4 F (36.9 C) (Oral)   Resp 16   Ht '5\' 3"'$  (1.6 m)   Wt 107.5 kg   SpO2 100%   BMI 41.98 kg/m  Physical  Exam Vitals and nursing note reviewed.  Constitutional:      General: She is not in acute distress.    Appearance: Normal appearance. She is normal weight. She is not ill-appearing, toxic-appearing or diaphoretic.  HENT:     Head: Normocephalic and atraumatic.  Cardiovascular:     Rate and Rhythm: Normal rate.  Pulmonary:     Effort: Pulmonary effort is normal. No respiratory distress.  Abdominal:     General: Abdomen is flat.     Palpations: Abdomen is soft.     Tenderness: There is abdominal tenderness in the right lower quadrant. Negative signs include Rovsing's sign.  Musculoskeletal:        General: Normal range of motion.     Cervical back: Normal range of motion.  Skin:    General: Skin is warm and dry.  Neurological:     General: No focal deficit present.     Mental Status: She is alert.  Psychiatric:        Mood and Affect: Mood normal.        Behavior: Behavior normal.     ED Results / Procedures / Treatments   Labs (all labs ordered are listed, but only abnormal results are displayed) Labs Reviewed  COMPREHENSIVE METABOLIC PANEL - Abnormal; Notable for the following components:      Result Value   Total Protein 8.3 (*)    All other components within normal limits  CBC WITH DIFFERENTIAL/PLATELET - Abnormal; Notable for the following components:   MCH 25.8 (*)    Neutro Abs 8.6 (*)    All other components within normal limits  URINALYSIS, ROUTINE W REFLEX MICROSCOPIC - Abnormal; Notable for the following components:   APPearance HAZY (*)    Hgb urine dipstick LARGE (*)    Leukocytes,Ua SMALL (*)    Bacteria, UA RARE (*)    All other components within normal limits  LIPASE, BLOOD  I-STAT BETA HCG BLOOD, ED ([facility], [facility], [facility] ONLY)    EKG None  Radiology US Pelvis Complete  Result Date: 11/11/2022 CLINICAL DATA:  Right lower quadrant pain, abnormal abdominal CT EXAM: TRANSABDOMINAL AND TRANSVAGINAL ULTRASOUND OF PELVIS DOPPLER ULTRASOUND OF OVARIES TECHNIQUE:  Both transabdominal and transvaginal ultrasound examinations of the pelvis were performed. Transabdominal technique was performed for global imaging of the pelvis including uterus, ovaries, adnexal regions, and pelvic cul-de-sac. It was necessary to proceed with endovaginal exam following the transabdominal exam to visualize the endometrium and adnexal structures. Color and duplex Doppler ultrasound was utilized to evaluate blood flow to the ovaries. COMPARISON:  11/11/2022 FINDINGS: Uterus Measurements: 7.9 x 4.3 by 5.2 cm = volume: 90.8 mL. No fibroids or other mass visualized. Endometrium Thickness: 4 mm.  No focal abnormality visualized. Right ovary Not identified. Limited evaluation of the right adnexa due to bowel gas. Left ovary Measurements: 3.5 x 3.7 x 4.9 cm = volume: 33.5 mL. There are numerous less than 10 mm follicle seen peripherally within the left ovary. No left adnexal mass. Pulsed Doppler evaluation of the left ovary demonstrates normal low-resistance arterial and venous waveforms. Other findings Trace pelvic free fluid likely physiologic. IMPRESSION: 1. Enlarged left ovary, with numerous peripherally oriented less than 10 mm follicles. Please correlate with clinical presentation for any signs of polycystic ovarian syndrome. No evidence of ovarian torsion. 2. The enlarged left ovary likely accounts for the CT finding. There are no other abnormalities within the left adnexa. 3. Nonvisualization of the right ovary due to bowel gas. 4. Trace pelvic free fluid, likely physiologic. Electronically Signed   By: Randa Ngo M.D.   On: 11/11/2022 16:28   US Transvaginal Non-OB  Result Date: 11/11/2022 CLINICAL DATA:  Right lower quadrant pain, abnormal abdominal CT EXAM: TRANSABDOMINAL AND TRANSVAGINAL ULTRASOUND OF PELVIS DOPPLER ULTRASOUND OF OVARIES TECHNIQUE: Both transabdominal and transvaginal ultrasound examinations of the pelvis were performed. Transabdominal technique was performed for  global imaging of the pelvis including uterus, ovaries, adnexal regions, and pelvic cul-de-sac. It was necessary to proceed with endovaginal exam following the transabdominal exam to visualize the endometrium and adnexal structures. Color and duplex Doppler ultrasound was utilized to evaluate blood flow to the ovaries. COMPARISON:  11/11/2022 FINDINGS: Uterus Measurements: 7.9 x 4.3 by 5.2 cm = volume: 90.8 mL. No fibroids or other mass visualized. Endometrium Thickness: 4 mm.  No focal abnormality visualized. Right ovary Not identified. Limited evaluation of the right adnexa due to bowel gas. Left ovary Measurements: 3.5 x 3.7 x 4.9 cm = volume: 33.5 mL. There are numerous less than 10 mm follicle seen peripherally within the left ovary. No left adnexal mass. Pulsed Doppler evaluation of the left ovary demonstrates normal low-resistance arterial and venous waveforms. Other findings Trace pelvic free fluid likely physiologic. IMPRESSION: 1. Enlarged left ovary, with numerous peripherally oriented less than 10 mm follicles. Please correlate with clinical presentation for any signs of polycystic ovarian syndrome. No evidence of ovarian torsion. 2. The enlarged left ovary likely  accounts for the CT finding. There are no other abnormalities within the left adnexa. 3. Nonvisualization of the right ovary due to bowel gas. 4. Trace pelvic free fluid, likely physiologic. Electronically Signed   By: Randa Ngo M.D.   On: 11/11/2022 16:28   [place] Art/Ven Flow Abd Pelv Doppler  Result Date: 11/11/2022 CLINICAL DATA:  Right lower quadrant pain, abnormal abdominal CT EXAM: TRANSABDOMINAL AND TRANSVAGINAL ULTRASOUND OF PELVIS DOPPLER ULTRASOUND OF OVARIES TECHNIQUE: Both transabdominal and transvaginal ultrasound examinations of the pelvis were performed. Transabdominal technique was performed for global imaging of the pelvis including uterus, ovaries, adnexal regions, and pelvic cul-de-sac. It was necessary to proceed with  endovaginal exam following the transabdominal exam to visualize the endometrium and adnexal structures. Color and duplex Doppler ultrasound was utilized to evaluate blood flow to the ovaries. COMPARISON:  11/11/2022 FINDINGS: Uterus Measurements: 7.9 x 4.3 by 5.2 cm = volume: 90.8 mL. No fibroids or other mass visualized. Endometrium Thickness: 4 mm.  No focal abnormality visualized. Right ovary Not identified. Limited evaluation of the right adnexa due to bowel gas. Left ovary Measurements: 3.5 x 3.7 x 4.9 cm = volume: 33.5 mL. There are numerous less than 10 mm follicle seen peripherally within the left ovary. No left adnexal mass. Pulsed Doppler evaluation of the left ovary demonstrates normal low-resistance arterial and venous waveforms. Other findings Trace pelvic free fluid likely physiologic. IMPRESSION: 1. Enlarged left ovary, with numerous peripherally oriented less than 10 mm follicles. Please correlate with clinical presentation for any signs of polycystic ovarian syndrome. No evidence of ovarian torsion. 2. The enlarged left ovary likely accounts for the CT finding. There are no other abnormalities within the left adnexa. 3. Nonvisualization of the right ovary due to bowel gas. 4. Trace pelvic free fluid, likely physiologic. Electronically Signed   By: Randa Ngo M.D.   On: 11/11/2022 16:28   CT ABDOMEN PELVIS W CONTRAST  Result Date: 11/11/2022 CLINICAL DATA:  Right lower quadrant abdominal pain associated with nausea EXAM: CT ABDOMEN AND PELVIS WITH CONTRAST TECHNIQUE: Multidetector CT imaging of the abdomen and pelvis was performed using the standard protocol following bolus administration of intravenous contrast. RADIATION DOSE REDUCTION: This exam was performed according to the departmental dose-optimization program which includes automated exposure control, adjustment of the mA and/or kV according to patient size and/or use of iterative reconstruction technique. CONTRAST:  110m OMNIPAQUE  IOHEXOL 300 MG/ML  SOLN COMPARISON:  None Available. FINDINGS: Lower chest: No focal consolidation or pulmonary nodule in the lung bases. No pleural effusion or pneumothorax demonstrated. Partially imaged heart size is normal. Hepatobiliary: No focal hepatic lesions. No intra or extrahepatic biliary ductal dilation. Normal gallbladder. Pancreas: No focal lesions or main ductal dilation. Spleen: Normal in size without focal abnormality. Adrenals/Urinary Tract: No adrenal nodules. No suspicious renal mass, calculi or hydronephrosis. No focal bladder wall thickening. Stomach/Bowel: Normal appearance of the stomach. Diffuse mural thickening and mucosal hyperenhancement of a loop of redundant sigmoid colon located in the midline pelvis/right lower quadrant. Normal appendix. Vascular/Lymphatic: No significant vascular findings are present. No enlarged abdominal or pelvic lymph nodes. Reproductive: Left adnexal cystic structure measuring 4.6 x 2.6 cm (2:83) appears separate from the left ovary. Other: Moderate volume pelvic free fluid.  No free air. Musculoskeletal: No acute or abnormal lytic or blastic osseous lesions. IMPRESSION: 1. Sigmoid colitis, either infectious or inflammatory. 2. Left adnexal cystic structure measuring up to 4.6 cm appears separate from the left ovary and may represent an exophytic ovarian  cyst, paraovarian cyst, or loculated fluid in the setting of colitis. Consider pelvic ultrasound examination for further evaluation. 3. Normal appendix. Electronically Signed   By: Darrin Nipper M.D.   On: 11/11/2022 15:10    Procedures Procedures    Medications Ordered in ED Medications  sodium chloride (PF) 0.9 % injection (  Given by Other 11/11/22 1505)  iohexol (OMNIPAQUE) 300 MG/ML solution 100 mL (100 mLs Intravenous Contrast Given 11/11/22 1448)  ketorolac (TORADOL) 15 MG/ML injection 15 mg (15 mg Intravenous Given 11/11/22 1604)    ED Course/ Medical Decision Making/ A&P                              Medical Decision Making Amount and/or Complexity of Data Reviewed Labs: ordered. Radiology: ordered.  Risk Prescription drug management.   This patient is a 22 y.o. female who presents to the ED for concern of abdominal pain, this involves an extensive number of treatment options, and is a complaint that carries with it a high risk of complications and morbidity. The emergent differential diagnosis prior to evaluation includes, but is not limited to, AAA, gastroenteritis, appendicitis, Bowel obstruction, Bowel perforation. Gastroparesis, DKA, Hernia, Inflammatory bowel disease, mesenteric ischemia, pancreatitis, peritonitis SBP, volvulus.  This is not an exhaustive differential.   Past Medical History / Co-morbidities / Social History: N/A  Physical Exam: Physical exam performed. The pertinent findings include: RLQ TTP  Lab Tests: I ordered, and personally interpreted labs.  The pertinent results include:  ne leukocytosis, UA with hematuria   Imaging Studies: I ordered imaging studies including CT abdomen pelvis, pelvic US. I independently visualized and interpreted imaging which showed   1. Sigmoid colitis, either infectious or inflammatory. 2. Left adnexal cystic structure measuring up to 4.6 cm appears separate from the left ovary and may represent an exophytic ovarian cyst, paraovarian cyst, or loculated fluid in the setting of colitis. Consider pelvic ultrasound examination for further evaluation. 3. Normal appendix.  [place]:  1. Enlarged left ovary, with numerous peripherally oriented less than 10 mm follicles. Please correlate with clinical presentation for any signs of polycystic ovarian syndrome. No evidence of ovarian torsion. 2. The enlarged left ovary likely accounts for the CT finding. There are no other abnormalities within the left adnexa. 3. Nonvisualization of the right ovary due to bowel gas. 4. Trace pelvic free fluid, likely physiologic.   I agree  with the radiologist interpretation.   Medications: I ordered medication including toradol  for pain. Reevaluation of the patient after these medicines showed that the patient improved. I have reviewed the patients home medicines and have made adjustments as needed.    Disposition:  Patient is nontoxic, nonseptic appearing, in no apparent distress.  Patient's pain and other symptoms adequately managed in emergency department.   Labs, imaging and vitals reviewed.  Patient does not meet the SIRS or Sepsis criteria.  On repeat exam patient does not have a surgical abdomin and there are no peritoneal signs.  No indication of appendicitis, bowel obstruction, bowel perforation, cholecystitis, diverticulitis, PID or ectopic pregnancy.  CT reveals colitis, US shows follicles, likely PCOS. I have informed the patient of these findings and recommend close pcp follow-up for this. Patient discharged home with Augmentin for colitis and Zofran for symptomatic treatment and given strict instructions for follow-up with their primary care physician.  I have also discussed reasons to return immediately to the ER.  Patient expresses understanding and agrees with  plan. Patient discharged in stable condition.   Final Clinical Impression(s) / ED Diagnoses Final diagnoses:  Colitis  Bilateral ovarian cysts    Rx / DC Orders ED Discharge Orders          Ordered    amoxicillin-clavulanate (AUGMENTIN) 875-125 MG tablet  Every 12 hours        11/11/22 1649    ondansetron (ZOFRAN-ODT) 4 MG disintegrating tablet  Every 8 hours PRN        11/11/22 1649          An After Visit Summary was printed and given to the patient.     Nestor Lewandowsky 11/11/22 1652    SmootLeary Roca, PA-C 11/11/22 1652    Cristie Hem, MD 11/12/22 0800

## 2022-11-11 NOTE — ED Triage Notes (Signed)
Patient arrives ambulatory by POV c/o lower right sided abdominal pain and nausea onset of last night.

## 2022-11-13 ENCOUNTER — Ambulatory Visit: Payer: Medicaid Other

## 2023-02-11 IMAGING — CR DG CHEST 2V
2 series · 2 of 2 positions shown · non-contrast
Comparison: 07/21/2020

CLINICAL DATA: Dyspnea

EXAM:
CHEST - 2 VIEW

[w chest pa]
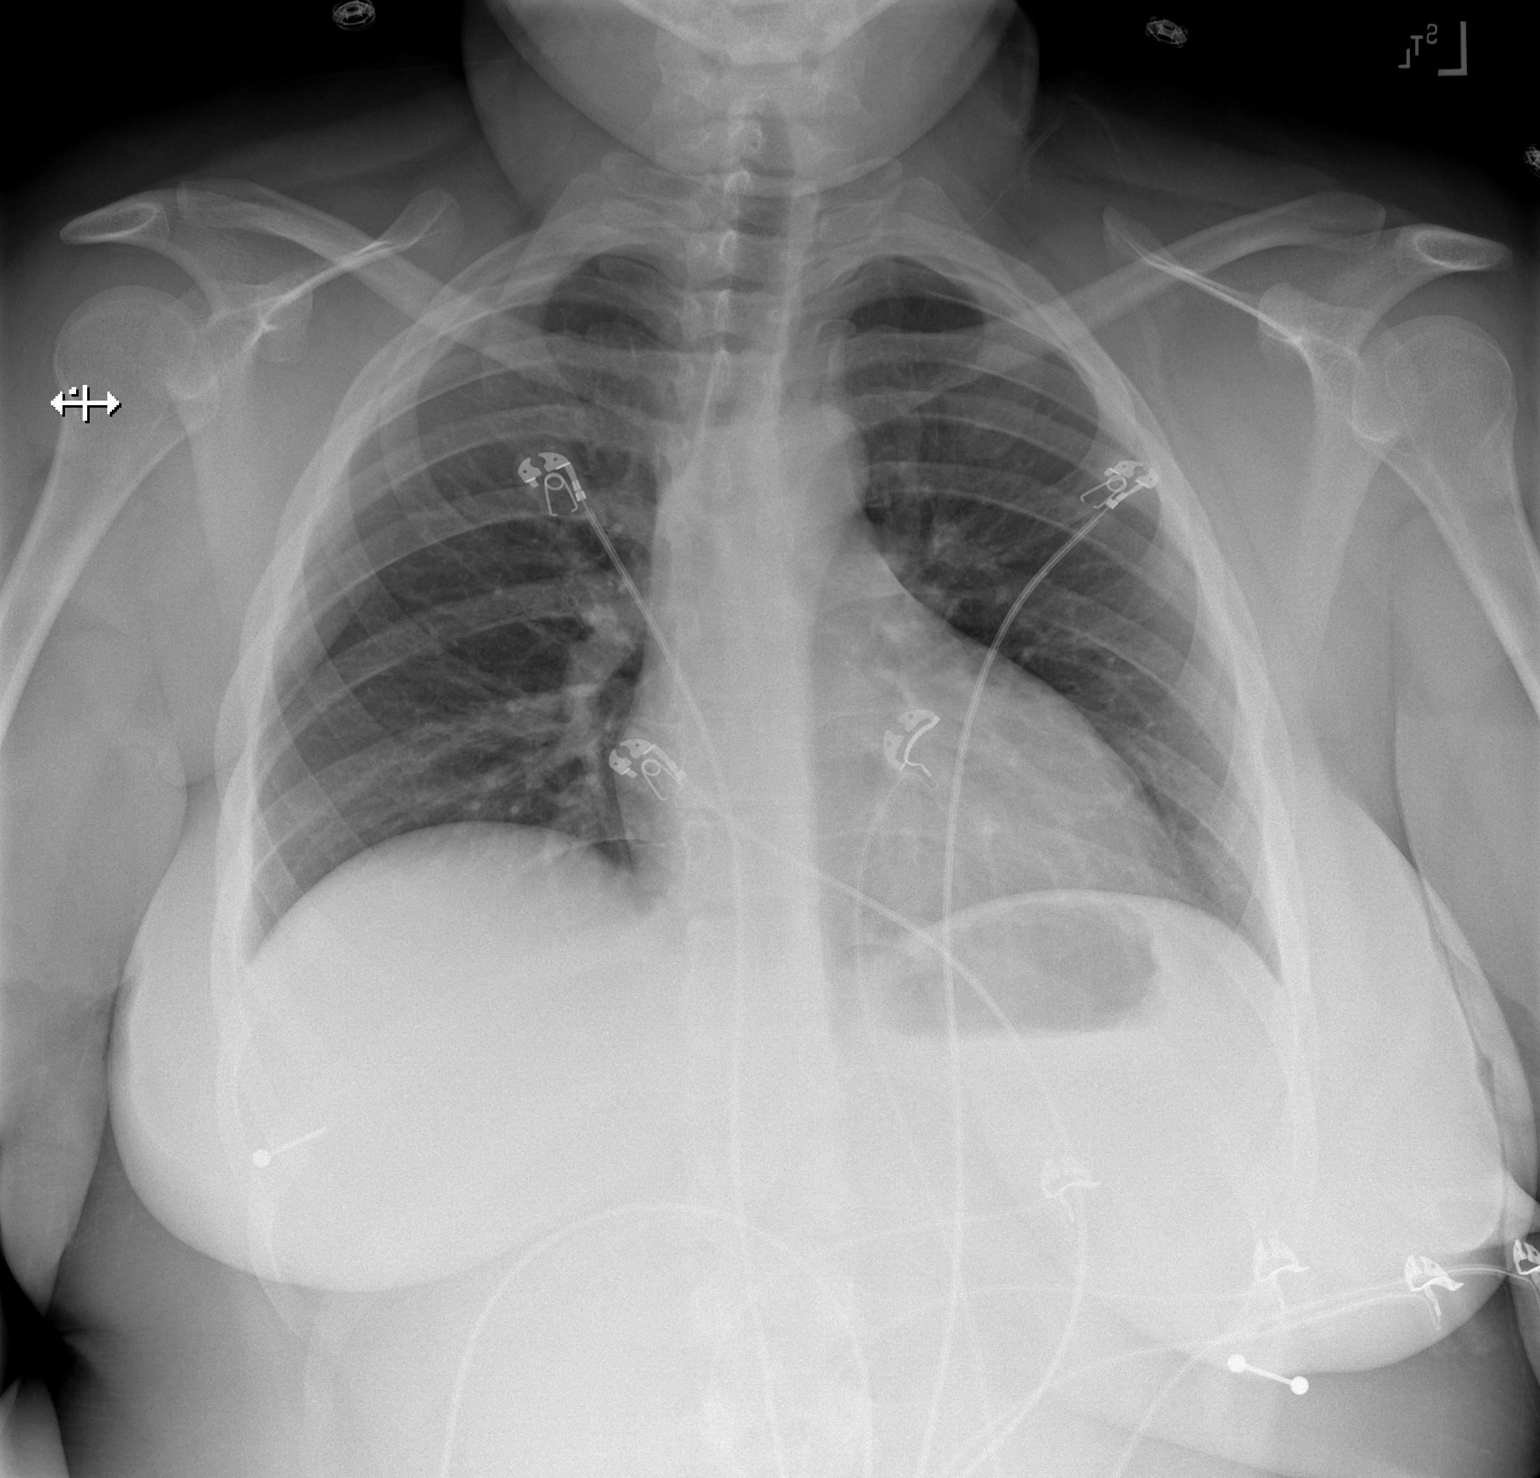

[w chest lat]
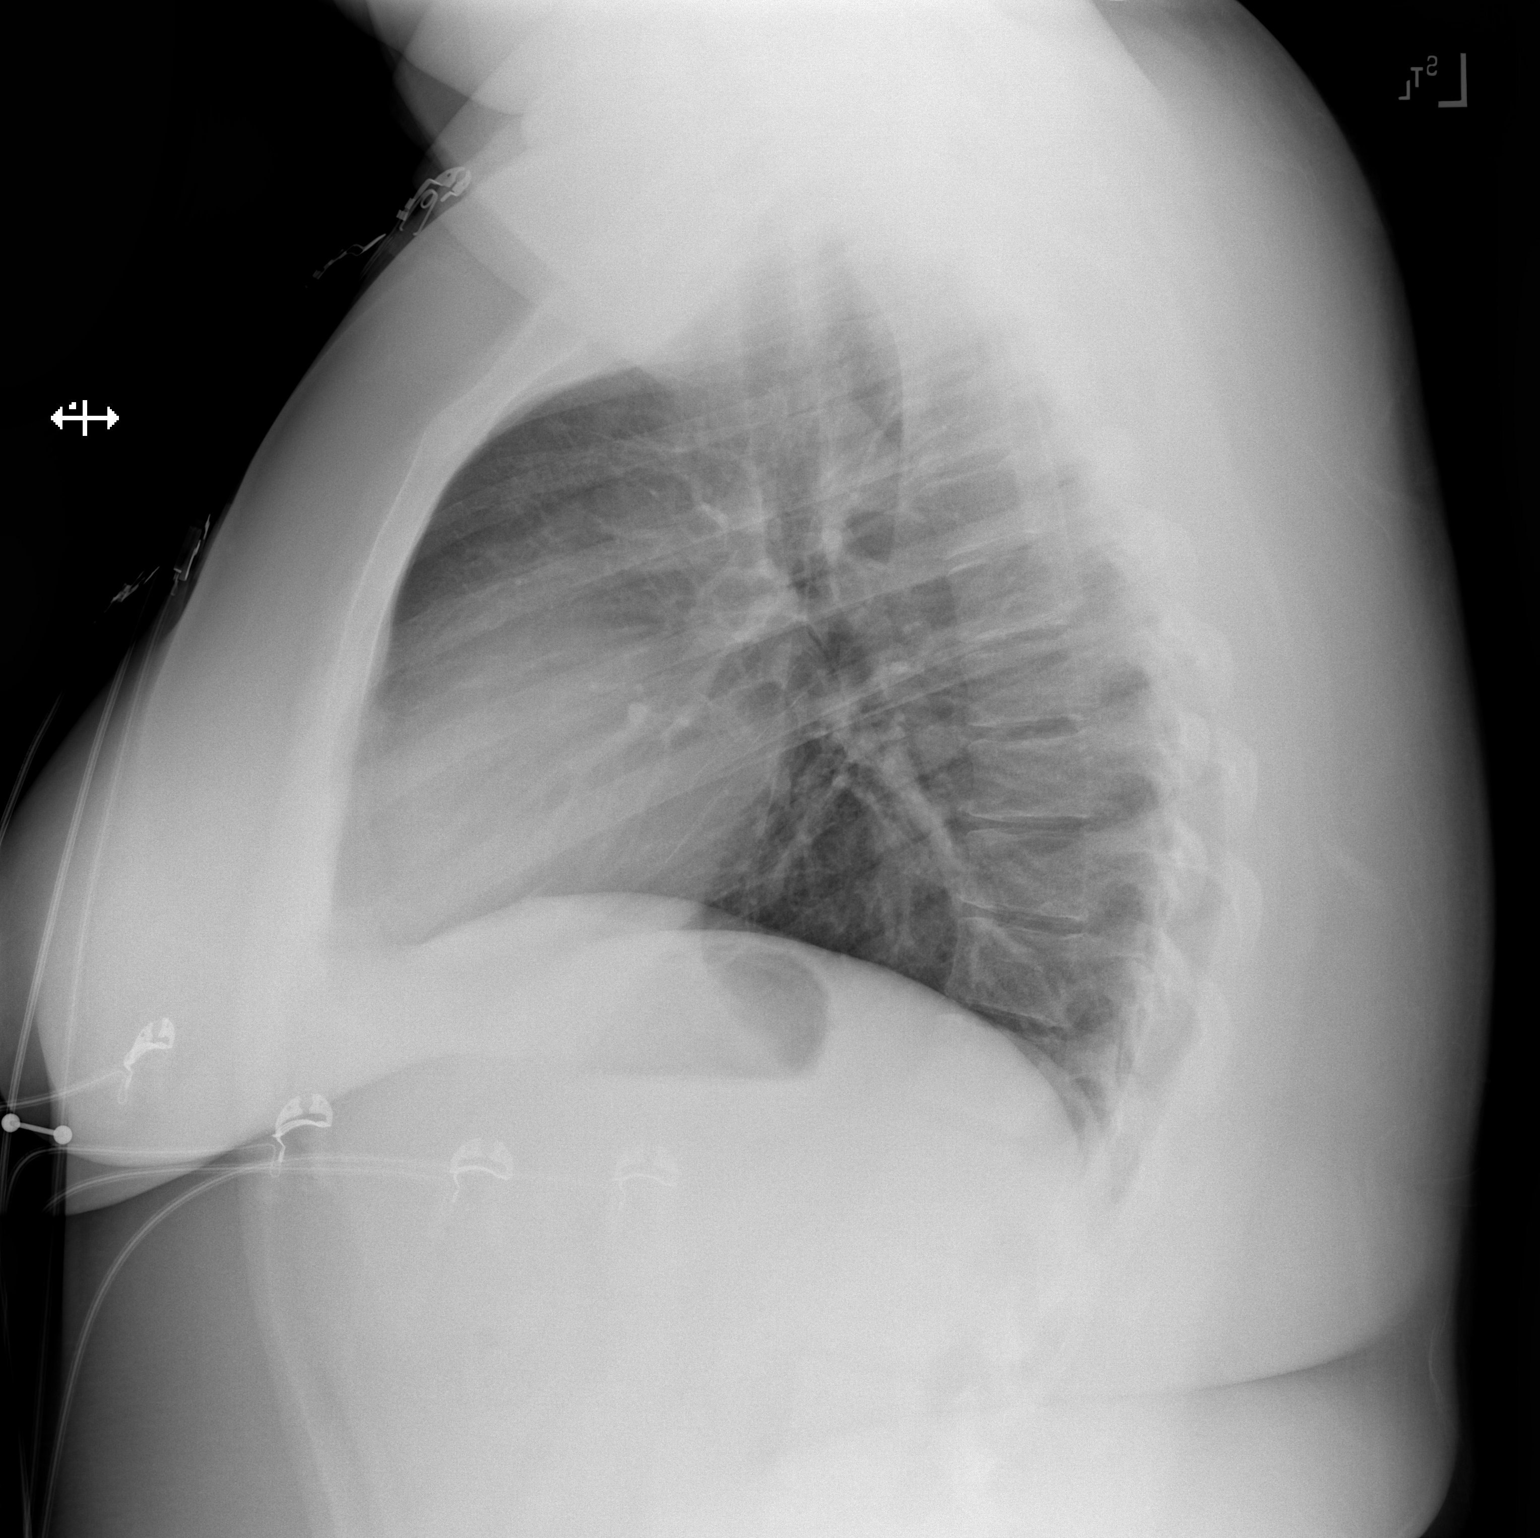

[2 of 2 positions shown; findings below may reference images not displayed]

FINDINGS: The heart size and mediastinal contours are within normal limits.
Both lungs are clear. The visualized skeletal structures are
unremarkable.
IMPRESSION: No active cardiopulmonary disease.

## 2024-07-20 ENCOUNTER — Ambulatory Visit

## 2024-07-20 DIAGNOSIS — Z3490 Encounter for supervision of normal pregnancy, unspecified, unspecified trimester: Secondary | ICD-10-CM

## 2024-07-20 DIAGNOSIS — Z3201 Encounter for pregnancy test, result positive: Secondary | ICD-10-CM | POA: Diagnosis not present

## 2024-07-20 DIAGNOSIS — Z3200 Encounter for pregnancy test, result unknown: Secondary | ICD-10-CM

## 2024-07-20 DIAGNOSIS — Z3A01 Less than 8 weeks gestation of pregnancy: Secondary | ICD-10-CM

## 2024-07-20 DIAGNOSIS — Z3491 Encounter for supervision of normal pregnancy, unspecified, first trimester: Secondary | ICD-10-CM

## 2024-07-20 DIAGNOSIS — O219 Vomiting of pregnancy, unspecified: Secondary | ICD-10-CM

## 2024-07-20 LAB — POCT PREGNANCY, URINE: Preg Test, Ur: POSITIVE — AB

## 2024-07-20 MED ORDER — PRENATAL PLUS VITAMIN/MINERAL 27-1 MG PO TABS: 1.0000 | ORAL_TABLET | Freq: Every day | ORAL | 11 refills | Status: AC

## 2024-07-20 MED ORDER — DOXYLAMINE-PYRIDOXINE 10-10 MG PO TBEC: 2.0000 | DELAYED_RELEASE_TABLET | Freq: Every day | ORAL | 2 refills | Status: DC

## 2024-07-20 NOTE — Patient Instructions (Addendum)
 Nausea  We recommend eating within an hour of getting up, some people even eat a few crackers before getting out of bed. Then make sure you eat at least something small every 2-3 hours. Going too long between meals can make the nausea worse. Also, try not to drink a lot of fluids on an empty stomach if you feel nauseous, this may just make it worse! Peppermint essential oil: you can keep this with you to smell or put a little behind your ears when you're nauseous Ginger candy: I specifically like the Traditional Medicinals Belly Comfort. You can search online for morning sickness drops/candy and find other things! You can also try Ginger Ale or ginger teas. Frozen fruits or popsicles  Safe Medications in Pregnancy   Acne:  Benzoyl Peroxide  Salicylic Acid    Backache/Headache:  Tylenol : 2 regular strength every 4 hours OR               2 Extra strength every 6 hours   Colds/Coughs/Allergies:  Benadryl  (alcohol free) 25 mg every 6 hours as needed  Breath right strips  Claritin  Cepacol throat lozenges  Chloraseptic throat spray  Cold-Eeze- up to three times per day  Cough drops, alcohol free  Flonase (by prescription only)  Guaifenesin  Mucinex  Robitussin DM (plain only, alcohol free)  Saline nasal spray/drops  Sudafed (pseudoephedrine) & Actifed * use only after [redacted] weeks gestation and if you do not have high blood pressure  Tylenol   Vicks Vaporub  Zinc lozenges  Zyrtec   Constipation:  Colace  Ducolax suppositories  Fleet enema  Glycerin  suppositories  Metamucil  Milk of magnesia  Miralax  Senokot  Smooth move tea   Diarrhea:  Kaopectate  Imodium A-D   *NO pepto Bismol   Hemorrhoids:  Anusol  Anusol HC  Preparation H  Tucks   Indigestion:  Tums  Maalox  Mylanta  Zantac  Pepcid   Insomnia:  Benadryl  (alcohol free) 25mg  every 6 hours as needed  Tylenol  PM  Unisom, no Gelcaps   Leg Cramps:  Tums  MagGel   Nausea/Vomiting:  Bonine  Dramamine   Emetrol  Ginger extract  Sea bands  Meclizine  Nausea medication to take during pregnancy:  Unisom (doxylamine succinate 25 mg tablets) Take one tablet daily at bedtime. If symptoms are not adequately controlled, the dose can be increased to a maximum recommended dose of two tablets daily (1/2 tablet in the morning, 1/2 tablet mid-afternoon and one at bedtime).  Vitamin B6 100mg  tablets. Take one tablet twice a day (up to 200 mg per day).   Skin Rashes:  Aveeno products  Benadryl  cream or 25mg  every 6 hours as needed  Calamine Lotion  1% cortisone cream   Yeast infection:  Gyne-lotrimin 7  Monistat 7    **If taking multiple medications, please check labels to avoid duplicating the same active ingredients  **take medication as directed on the label  ** Do not exceed 4000 mg of tylenol  in 24 hours  **Do not take medications that contain aspirin or ibuprofen 

## 2024-07-20 NOTE — Progress Notes (Signed)
 Possible Pregnancy  Here today to leave urine specimen for pregnancy confirmation. UPT in office today is positive. Called patient to review results and recent history. Pt reports first positive home UPT on 07/15/24. Describes monthly menstrual period with varying cycle length, will have dating US  following new OB intake. Reviewed dating with patient:   LMP: 05/28/24 EDD: 03/04/2025  7w 4d today  OB history reviewed; single, full-term vaginal birth, IOL at 37w due to abnormal BPP (in antenatal testing due to BMI of 42). Reviewed medications and allergies with patient; list of medications safe to take during pregnancy given. Reports nausea without vomiting, Diclegis ordered. Recommended pt begin prenatal vitamin and schedule prenatal care. Message sent to front office staff to schedule new OB appts.  Vernell FORBES Ruddle, RN 07/20/2024  9:26 AM

## 2024-08-16 NOTE — Progress Notes (Unsigned)
 New OB Intake  I connected with Paula Noble  on 08/16/24 at  9:15 AM EST by MyChart Video Visit and verified that I am speaking with the correct person using two identifiers. Nurse is located at [facility] and pt is located at home.  I discussed the limitations, risks, security and privacy concerns of performing an evaluation and management service by telephone and the availability of in person appointments. I also discussed with the patient that there may be a patient responsible charge related to this service. The patient expressed understanding and agreed to proceed.  I explained I am completing New OB Intake today. We discussed EDD of 03/04/2025 based on LMP of 05/28/2024. Pt is [id number]. I reviewed her allergies, medications and Medical/Surgical/OB history.    Patient Active Problem List   Diagnosis Date Noted   Vaginal delivery 11/30/2021   Rubella non-immune status, antepartum 05/27/2021     Concerns addressed today  Delivery Plans Plans to deliver at [facility]. Discussed the nature of our practice with multiple providers including residents and students as well as female and female providers. Due to the size of the practice, the delivering provider may not be the same as those providing prenatal care.   Patient {Is/is not:9024} interested in water birth.  MyChart/Babyscripts MyChart access verified. I explained pt will have some visits in office and some virtually. Babyscripts instructions given and order placed. Patient verifies receipt of registration text/e-mail. Account successfully created and app downloaded. If patient is a candidate for Optimized scheduling, add to sticky note.   Blood Pressure Cuff/Weight Scale Blood pressure cuff ordered for patient to pick-up from [employer]. Explained after first prenatal appt pt will check weekly and document in Babyscripts.   Anatomy US  Explained first scheduled US  will be around 19 weeks. Anatomy US  scheduled for *** at  ***.  Is patient a CenteringPregnancy candidate?  {Accepted:19197::Accepted,Not a Candidate,Declined} Declined due to {Declined:19197::Schedule,Childcare,Group setting,Support person concern,Declined to say,Enrolled in [facility],***} Not a candidate due to {Not a Candidate:19197::DM,CHTN, medication controlled,Language barrier,>28 weeks,Multiple gestation (mono-mono or mono-di),Complex coordination of care needed,***} If accepted,    Is patient a Mom+Baby Combined Care candidate?  Not a candidate   If accepted, confirm patient does not intend to move from the area for at least 12 months, then notify Mom+Baby staff  Is patient a candidate for Babyscripts Optimization? {babyscripts:31704}   First visit review I reviewed new OB appt with patient. Explained pt will be seen by Cornell Finder, CNM at first visit. Discussed [place] genetic screening with patient. *** Panor[employer] and Horizon.. Routine prenatal labs is needed at [facility] appointment.  Last Pap Diagnosis  Date Value Ref Range Status  05/29/2022   Final   - Negative for intraepithelial lesion or malignancy (NILM)    Paula Noble, CMA 08/16/2024  5:26 PM

## 2024-08-17 ENCOUNTER — Telehealth

## 2024-08-17 DIAGNOSIS — Z3490 Encounter for supervision of normal pregnancy, unspecified, unspecified trimester: Secondary | ICD-10-CM | POA: Insufficient documentation

## 2024-08-17 DIAGNOSIS — Z3491 Encounter for supervision of normal pregnancy, unspecified, first trimester: Secondary | ICD-10-CM | POA: Diagnosis not present

## 2024-08-17 DIAGNOSIS — Z3A11 11 weeks gestation of pregnancy: Secondary | ICD-10-CM

## 2024-08-17 MED ORDER — BLOOD PRESSURE KIT DEVI: 1.0000 | 0 refills | Status: AC | PRN

## 2024-08-17 MED ORDER — PROMETHAZINE HCL 25 MG PO TABS: 25.0000 mg | ORAL_TABLET | Freq: Four times a day (QID) | ORAL | 0 refills | Status: AC | PRN

## 2024-08-17 NOTE — Patient Instructions (Signed)

## 2024-08-19 ENCOUNTER — Other Ambulatory Visit: Payer: Self-pay

## 2024-08-19 ENCOUNTER — Other Ambulatory Visit

## 2024-08-19 ENCOUNTER — Other Ambulatory Visit: Payer: Self-pay | Admitting: Certified Nurse Midwife

## 2024-08-19 DIAGNOSIS — Z3A11 11 weeks gestation of pregnancy: Secondary | ICD-10-CM | POA: Diagnosis not present

## 2024-08-19 DIAGNOSIS — Z3491 Encounter for supervision of normal pregnancy, unspecified, first trimester: Secondary | ICD-10-CM | POA: Diagnosis not present

## 2024-08-25 ENCOUNTER — Ambulatory Visit: Admitting: Certified Nurse Midwife

## 2024-08-25 ENCOUNTER — Other Ambulatory Visit (HOSPITAL_COMMUNITY)
Admission: RE | Admit: 2024-08-25 | Discharge: 2024-08-25 | Disposition: A | Discharge status: Home / Self Care | Source: Ambulatory Visit | Attending: Certified Nurse Midwife | Admitting: Certified Nurse Midwife

## 2024-08-25 ENCOUNTER — Other Ambulatory Visit: Payer: Self-pay

## 2024-08-25 VITALS — BP 113/82 | HR 81 | Wt 253.9 lb

## 2024-08-25 DIAGNOSIS — Z3491 Encounter for supervision of normal pregnancy, unspecified, first trimester: Secondary | ICD-10-CM | POA: Insufficient documentation

## 2024-08-25 DIAGNOSIS — Z3A12 12 weeks gestation of pregnancy: Secondary | ICD-10-CM | POA: Diagnosis not present

## 2024-08-25 DIAGNOSIS — Z8639 Personal history of other endocrine, nutritional and metabolic disease: Secondary | ICD-10-CM

## 2024-08-25 NOTE — Progress Notes (Signed)
 History:   Paula Noble is a 23 y.o. G2P1001 at [redacted]w[redacted]d by LMP, early ultrasound being seen today for her first obstetrical visit.  Her obstetrical history is significant for obesity and delivery at 37wks for NRNST/BPP. Patient does not intend to breast feed. Pregnancy history fully reviewed.  Patient reports no complaints.   HISTORY: OB History  Gravida Para Term Preterm AB Living  2 1 1  0 0 1  SAB IAB Ectopic Multiple Live Births  0 0 0 0 1    # Outcome Date GA Lbr Len/2nd Weight Sex Type Anes PTL Lv  2 Current           1 Term 11/30/21 [redacted]w[redacted]d 03:39 / 00:34 6 lb 11.6 oz (3.05 kg) F Vag-Spont EPI  LIV     Birth Comments: WDL     Name: Paula Noble     Apgar1: 9  Apgar5: 9    Last pap smear was done 05/29/22 and was normal  Past Medical History:  Diagnosis Date   ? fetal VSD vs tetraology of fallot 07/23/2021   Seen on mfm anatomy u/s at 18wks  - confirmed normal on 08/13/21 by fetal echo S/p low risk cffdna      Insulin resistance 01/06/2014   Microcytic anemia 09/12/2018   Past Surgical History:  Procedure Laterality Date   NO PAST SURGERIES     Family History  Problem Relation Age of Onset   Diabetes Mother    Healthy Father    Social History   Tobacco Use   Smoking status: Never   Smokeless tobacco: Never   Tobacco comments:    pt states it has been months  Vaping Use   Vaping status: Former   Quit date: 05/03/2021   Substances: Nicotine, Flavoring  Substance Use Topics   Alcohol use: Never   Drug use: Not Currently    Types: Marijuana    Comment: last smoked 4 years ago   No Known Allergies Current Outpatient Medications on File Prior to Visit  Medication Sig Dispense Refill   Blood Pressure Monitoring (BLOOD PRESSURE KIT) DEVI 1 each by Does not apply route as needed. 1 each 0   Prenatal Vit-Fe Fumarate-FA (PRENATAL PLUS VITAMIN/MINERAL) 27-1 MG TABS Take 1 tablet by mouth daily. 30 tablet 11   promethazine  (PHENERGAN ) 25 MG tablet  Take 1 tablet (25 mg total) by mouth every 6 (six) hours as needed for nausea or vomiting. 30 tablet 0   Doxylamine -Pyridoxine  (DICLEGIS ) 10-10 MG TBEC Take 2 tablets by mouth at bedtime. May add 1 tablet at breakfast and 1 tablet at lunch if needed. (Patient not taking: Reported on 08/25/2024) 100 tablet 2   No current facility-administered medications on file prior to visit.    Review of Systems Pertinent items noted in HPI and remainder of comprehensive ROS otherwise negative. Physical Exam:   Vitals:   08/25/24 0945  BP: 113/82  Pulse: 81  Weight: 253 lb 14.4 oz (115.2 kg)   Fetal Heart Rate (bpm): 165  Constitutional: Well-developed, well-nourished pregnant female in no acute distress.  HEENT: PERRLA Skin: normal color and turgor, no rash Cardiovascular: normal rate & rhythm, warm and well perfused Respiratory: normal effort, no problems with respiration noted GI: Abd soft, non-distended MS: Extremities nontender, no edema, normal ROM Neurologic: Alert and oriented x 4.  GU: no CVA tenderness Pelvic: Exam deferred  Assessment:   Pregnancy: G2P1001 Patient Active Problem List   Diagnosis Date Noted   Supervision of low-risk  pregnancy 08/17/2024   Rubella non-immune status, antepartum 05/27/2021   Plan:   1. Encounter for supervision of low-risk pregnancy in first trimester (Primary) - Doing well, excited for the pregnancy. - PANORAMA PRENATAL TEST - GC/Chlamydia probe amp ([place])not at [facility] - Culture, OB Urine - CBC/D/Plt+RPR+Rh+ABO+RubIgG...  2. [redacted] weeks gestation of pregnancy - Routine OB care, will offer AFP at next visit  3. History of insulin resistance - Hemoglobin A1c - Thyroid  Panel With TSH  4. Initial obstetric visit in first trimester - Prefers midwifery care. - Initial labs drawn. - Continue prenatal vitamins. - Problem list reviewed and updated. - Genetic Screening discussed, First trimester screen, Quad screen, and NIPS: ordered. -  Ultrasound discussed; fetal anatomic survey: ordered. - Anticipatory guidance about prenatal visits given including labs, ultrasounds, and testing. - Discussed usage of Babyscripts and virtual visits as additional source of managing and completing prenatal visits in midst of coronavirus and pandemic.   - Encouraged to complete MyChart Registration for her ability to review results, send requests, and have questions addressed.  - The nature of [place] [facility] with multiple MDs and Advanced Practice Providers was explained to patient; also emphasized that residents, students are part of our team. - Routine obstetric precautions reviewed. Encouraged to seek out care at office or emergency room [facility] preferred) for urgent and/or emergent concerns.  Return in about 4 weeks (around 09/22/2024) for IN-PERSON, LOB.    Future Appointments  Date Time Provider Department Center  09/22/2024  3:15 PM Vannie Cornell JONELLE EDDY [facility]  10/15/2024 10:00 AM [facility] PROVIDER 1 [facility]  10/15/2024 10:30 AM [facility] US3 [facility]  10/20/2024 11:15 AM Vannie Cornell JONELLE, CNM [facility]  11/17/2024  8:15 AM Vannie Cornell JONELLE, CNM [facility]  12/15/2024  8:20 AM W[facility]-WOCA LAB [facility]  12/15/2024  9:15 AM Vannie Cornell JONELLE, CNM [facility]  12/29/2024  9:35 AM Vannie Cornell JONELLE, CNM [facility]  01/12/2025  9:35 AM Vannie Cornell JONELLE, CNM [facility]  01/26/2025 10:55 AM Vannie Cornell JONELLE, CNM [facility]  02/09/2025 10:15 AM Vannie Cornell JONELLE, CNM [facility]  02/16/2025  3:15 PM Vannie Cornell JONELLE EDDY [facility]  02/23/2025  3:15 PM Vannie Cornell JONELLE EDDY [facility]  03/02/2025  3:15 PM Vannie Cornell JONELLE EDDY [facility]  03/09/2025  3:15 PM Vannie Cornell JONELLE, CNM [facility]    Cornell Vannie, MSN, CNM, IBCLC Certified Nurse Midwife, [facility]

## 2024-08-26 LAB — CBC/D/PLT+RPR+RH+ABO+RUBIGG...
Antibody Screen: NEGATIVE
Basophils Absolute: 0 x10E3/uL (ref 0.0–0.2)
Basos: 0 %
EOS (ABSOLUTE): 0.1 x10E3/uL (ref 0.0–0.4)
Eos: 1 %
HCV Ab: NONREACTIVE
HIV Screen 4th Generation wRfx: NONREACTIVE
Hematocrit: 35.6 % (ref 34.0–46.6)
Hemoglobin: 11.5 g/dL (ref 11.1–15.9)
Hepatitis B Surface Ag: NEGATIVE
Immature Grans (Abs): 0 x10E3/uL (ref 0.0–0.1)
Immature Granulocytes: 0 %
Lymphocytes Absolute: 1.2 x10E3/uL (ref 0.7–3.1)
Lymphs: 18 %
MCH: 26.6 pg (ref 26.6–33.0)
MCHC: 32.3 g/dL (ref 31.5–35.7)
MCV: 82 fL (ref 79–97)
Monocytes Absolute: 0.3 x10E3/uL (ref 0.1–0.9)
Monocytes: 4 %
Neutrophils Absolute: 5.2 x10E3/uL (ref 1.4–7.0)
Neutrophils: 77 %
Platelets: 207 x10E3/uL (ref 150–450)
RBC: 4.32 x10E6/uL (ref 3.77–5.28)
RDW: 15.6 % — ABNORMAL HIGH (ref 11.7–15.4)
RPR Ser Ql: NONREACTIVE
Rh Factor: POSITIVE
Rubella Antibodies, IGG: 1.52 {index} (ref >0.99)
WBC: 6.8 x10E3/uL (ref 3.4–10.8)

## 2024-08-26 LAB — GC/CHLAMYDIA PROBE AMP (~~LOC~~) NOT AT ARMC
Chlamydia: NEGATIVE
Comment: NEGATIVE
Comment: NORMAL
Neisseria Gonorrhea: NEGATIVE

## 2024-08-26 LAB — HEMOGLOBIN A1C
Est. average glucose Bld gHb Est-mCnc: 108 mg/dL
Hgb A1c MFr Bld: 5.4 % (ref 4.8–5.6)

## 2024-08-26 LAB — HCV INTERPRETATION

## 2024-08-26 LAB — THYROID PANEL WITH TSH
Free Thyroxine Index: 2.2 (ref 1.2–4.9)
T3 Uptake Ratio: 18 % — ABNORMAL LOW (ref 24–39)
T4, Total: 12.4 ug/dL — ABNORMAL HIGH (ref 4.5–12.0)
TSH: 0.839 u[IU]/mL (ref 0.450–4.500)

## 2024-08-30 ENCOUNTER — Encounter: Payer: Self-pay | Admitting: *Deleted

## 2024-08-30 LAB — URINE CULTURE, OB REFLEX

## 2024-08-30 LAB — CULTURE, OB URINE

## 2024-08-31 LAB — PANORAMA PRENATAL TEST FULL PANEL:PANORAMA TEST PLUS 5 ADDITIONAL MICRODELETIONS: FETAL FRACTION: 6.4

## 2024-09-06 ENCOUNTER — Encounter: Payer: Self-pay | Admitting: General Practice

## 2024-09-22 ENCOUNTER — Other Ambulatory Visit: Payer: Self-pay

## 2024-09-22 ENCOUNTER — Ambulatory Visit (INDEPENDENT_AMBULATORY_CARE_PROVIDER_SITE_OTHER): Admitting: Certified Nurse Midwife

## 2024-09-22 VITALS — BP 111/73 | HR 91 | Wt 257.0 lb

## 2024-09-22 DIAGNOSIS — R002 Palpitations: Secondary | ICD-10-CM

## 2024-09-22 DIAGNOSIS — Z3A16 16 weeks gestation of pregnancy: Secondary | ICD-10-CM

## 2024-09-22 DIAGNOSIS — Z3492 Encounter for supervision of normal pregnancy, unspecified, second trimester: Secondary | ICD-10-CM

## 2024-09-22 NOTE — Progress Notes (Signed)
 "  PRENATAL VISIT NOTE  Subjective:  Paula Noble is a 24 y.o. G2P1001 at [redacted]w[redacted]d being seen today for ongoing prenatal care.  She is currently monitored for the following issues for this low-risk pregnancy and has Rubella non-immune status, antepartum and Supervision of low-risk pregnancy on their problem list.  Patient reports occasional palpitations and SOB, sometimes concurrent.  Contractions: Not present. Vag. Bleeding: None.   . Denies leaking of fluid.   The following portions of the patient's history were reviewed and updated as appropriate: allergies, current medications, past family history, past medical history, past social history, past surgical history and problem list.   Objective:   Vitals:   09/22/24 1539  BP: 111/73  Pulse: 91  Weight: 257 lb (116.6 kg)   Fetal Status:  Fetal Heart Rate (bpm): 160        General: Alert, oriented and cooperative. Patient is in no acute distress.  Skin: Skin is warm and dry. No rash noted.   Cardiovascular: Normal heart rate noted  Respiratory: Normal respiratory effort, no problems with respiration noted  Abdomen: Soft, gravid, appropriate for gestational age.  Pain/Pressure: Absent     Pelvic: Cervical exam deferred        Extremities: Normal range of motion.  Edema: None  Mental Status: Normal mood and affect. Normal behavior. Normal judgment and thought content.      08/25/2024    9:40 AM 05/29/2022    9:52 AM 10/24/2021   11:47 AM  Depression screen PHQ 2/9  Decreased Interest 1 0 1  Down, Depressed, Hopeless 0 0 1  PHQ - 2 Score 1 0 2  Altered sleeping 1 0 1  Tired, decreased energy 1 0 1  Change in appetite 0 0 1  Feeling bad or failure about yourself  0 0 1  Trouble concentrating 0 0 1  Moving slowly or fidgety/restless 0 0 0  Suicidal thoughts 0 0 0  PHQ-9 Score 3 0  7      Data saved with a previous flowsheet row definition        08/25/2024    9:40 AM 05/29/2022    9:52 AM 10/24/2021   11:47 AM  10/10/2021    4:04 PM  GAD 7 : Generalized Anxiety Score  Nervous, Anxious, on Edge 0 0 1 1  Control/stop worrying 0 0 1 1  Worry too much - different things 0 0 1 1  Trouble relaxing 0 0 0 0  Restless 0 0 0 0  Easily annoyed or irritable 0 0 1 0  Afraid - awful might happen 0 0 1 1  Total GAD 7 Score 0 0 5 4    Assessment and Plan:  Pregnancy: G2P1001 at [redacted]w[redacted]d 1. Encounter for supervision of low-risk pregnancy in second trimester (Primary) - Doing well overall  2. [redacted] weeks gestation of pregnancy - Routine OB care  - AFP, Serum, Open Spina Bifida  3. Palpitations - Thyroid  panel off, but TSH normal, no other s/sx thyroid  dysfunction - Normal initial hemoglobin and A1C - AMB Referral to Cardio Obstetrics  Preterm labor symptoms and general obstetric precautions including but not limited to vaginal bleeding, contractions, leaking of fluid and fetal movement were reviewed in detail with the patient. Please refer to After Visit Summary for other counseling recommendations.   Return in about 4 weeks (around 10/20/2024) for IN-PERSON, LOB.  Future Appointments  Date Time Provider Department Center  10/15/2024 10:00 AM [facility] PROVIDER 1 [facility]  10/15/2024 10:30 AM [facility] US3 [facility]  10/20/2024 11:15 AM Vannie Cornell SAUNDERS, CNM [facility]  11/17/2024  8:15 AM Vannie Cornell SAUNDERS, CNM [facility]  12/15/2024  8:20 AM [facility]WOCA LAB [facility]  12/15/2024  9:15 AM Vannie Cornell SAUNDERS, CNM [facility]  12/29/2024  9:35 AM Vannie Cornell SAUNDERS, CNM [facility]  01/12/2025  9:35 AM Vannie Cornell SAUNDERS, CNM [facility]  01/26/2025 10:55 AM Vannie Cornell SAUNDERS, CNM [facility]  02/09/2025 10:15 AM Vannie Cornell SAUNDERS, CNM [facility]  02/16/2025  3:15 PM Vannie Cornell SAUNDERS EDDY [facility]  02/23/2025  3:15 PM Vannie Cornell SAUNDERS EDDY [facility]  03/02/2025  3:15 PM Vannie Cornell SAUNDERS EDDY [facility]  03/09/2025  3:15 PM Vannie Cornell SAUNDERS, CNM [facility]   Cornell SAUNDERS Vannie, CNM  "

## 2024-09-24 LAB — AFP, SERUM, OPEN SPINA BIFIDA
AFP MoM: 0.51
AFP Value: 12.9 ng/mL
Gest. Age on Collection Date: 16.5 wk
Maternal Age At EDD: 24.3 a
OSBR Risk 1 IN: 10000
Test Results:: NEGATIVE
Weight: 257 [lb_av]

## 2024-09-26 ENCOUNTER — Ambulatory Visit: Payer: Self-pay | Admitting: Certified Nurse Midwife

## 2024-10-01 DIAGNOSIS — O99210 Obesity complicating pregnancy, unspecified trimester: Secondary | ICD-10-CM | POA: Insufficient documentation

## 2024-10-14 ENCOUNTER — Encounter: Payer: Self-pay | Admitting: Certified Nurse Midwife

## 2024-10-15 ENCOUNTER — Ambulatory Visit: Attending: Obstetrics and Gynecology

## 2024-10-15 ENCOUNTER — Ambulatory Visit (HOSPITAL_BASED_OUTPATIENT_CLINIC_OR_DEPARTMENT_OTHER): Admitting: Obstetrics and Gynecology

## 2024-10-15 VITALS — BP 110/73 | HR 89

## 2024-10-15 DIAGNOSIS — Z363 Encounter for antenatal screening for malformations: Secondary | ICD-10-CM | POA: Diagnosis not present

## 2024-10-15 DIAGNOSIS — E669 Obesity, unspecified: Secondary | ICD-10-CM | POA: Diagnosis not present

## 2024-10-15 DIAGNOSIS — O99210 Obesity complicating pregnancy, unspecified trimester: Secondary | ICD-10-CM

## 2024-10-15 DIAGNOSIS — O99212 Obesity complicating pregnancy, second trimester: Secondary | ICD-10-CM | POA: Diagnosis not present

## 2024-10-15 DIAGNOSIS — Z3A11 11 weeks gestation of pregnancy: Secondary | ICD-10-CM | POA: Diagnosis present

## 2024-10-15 DIAGNOSIS — Z3689 Encounter for other specified antenatal screening: Secondary | ICD-10-CM

## 2024-10-15 DIAGNOSIS — Z3A20 20 weeks gestation of pregnancy: Secondary | ICD-10-CM | POA: Diagnosis not present

## 2024-10-20 ENCOUNTER — Other Ambulatory Visit: Payer: Self-pay

## 2024-10-20 ENCOUNTER — Ambulatory Visit: Admitting: Certified Nurse Midwife

## 2024-10-20 VITALS — BP 109/76 | HR 106 | Wt 258.2 lb

## 2024-10-20 DIAGNOSIS — Z3A20 20 weeks gestation of pregnancy: Secondary | ICD-10-CM | POA: Diagnosis not present

## 2024-10-20 DIAGNOSIS — Z23 Encounter for immunization: Secondary | ICD-10-CM | POA: Diagnosis not present

## 2024-10-20 DIAGNOSIS — R002 Palpitations: Secondary | ICD-10-CM

## 2024-10-20 DIAGNOSIS — Z3492 Encounter for supervision of normal pregnancy, unspecified, second trimester: Secondary | ICD-10-CM

## 2024-10-20 NOTE — Patient Instructions (Signed)
 SABRA

## 2024-10-20 NOTE — Progress Notes (Signed)
 "  PRENATAL VISIT NOTE  Subjective:  Paula Noble is a 24 y.o. G2P1001 at [redacted]w[redacted]d being seen today for ongoing prenatal care.  She is currently monitored for the following issues for this low-risk pregnancy and has Rubella non-immune status, antepartum; Supervision of low-risk pregnancy; and Obesity affecting pregnancy, antepartum on their problem list.  Patient reports previous palpitations resolved when she had a week off of work, but has felt them again since starting back. Also reports aching feet after work .  Denies leaking of fluid.   The following portions of the patient's history were reviewed and updated as appropriate: allergies, current medications, past family history, past medical history, past social history, past surgical history and problem list.   Objective:   Vitals:   10/20/24 1225  BP: 109/76  Pulse: (!) 106  Weight: 258 lb 3.2 oz (117.1 kg)    Fetal Status:  Fetal Heart Rate (bpm): 150   Movement: Present    General: Alert, oriented and cooperative. Patient is in no acute distress.  Skin: Skin is warm and dry. No rash noted.   Cardiovascular: Normal heart rate noted  Respiratory: Normal respiratory effort, no problems with respiration noted  Abdomen: Soft, gravid, appropriate for gestational age.  Pain/Pressure: Absent     Pelvic: Cervical exam deferred        Extremities: Normal range of motion.  Edema: None  Mental Status: Normal mood and affect. Normal behavior. Normal judgment and thought content.      08/25/2024    9:40 AM 05/29/2022    9:52 AM 10/24/2021   11:47 AM  Depression screen PHQ 2/9  Decreased Interest 1 0 1  Down, Depressed, Hopeless 0 0 1  PHQ - 2 Score 1 0 2  Altered sleeping 1 0 1  Tired, decreased energy 1 0 1  Change in appetite 0 0 1  Feeling bad or failure about yourself  0 0 1  Trouble concentrating 0 0 1  Moving slowly or fidgety/restless 0 0 0  Suicidal thoughts 0 0 0  PHQ-9 Score 3 0  7      Data saved with a  previous flowsheet row definition        08/25/2024    9:40 AM 05/29/2022    9:52 AM 10/24/2021   11:47 AM 10/10/2021    4:04 PM  GAD 7 : Generalized Anxiety Score  Nervous, Anxious, on Edge 0  0  1  1   Control/stop worrying 0  0  1  1   Worry too much - different things 0  0  1  1   Trouble relaxing 0  0  0  0   Restless 0  0  0  0   Easily annoyed or irritable 0  0  1  0   Afraid - awful might happen 0  0  1  1   Total GAD 7 Score 0 0 5 4     Data saved with a previous flowsheet row definition    Assessment and Plan:  Pregnancy: G2P1001 at [redacted]w[redacted]d 1. Encounter for supervision of low-risk pregnancy in second trimester (Primary) - Doing well overall. - Feeling baby move - Recommended compression socks to help with circulation - Also magnesium  supplement to help with sleep  2. [redacted] weeks gestation of pregnancy - Routine OB care  3. Palpitations - Plans to call OB cardio back to schedule appointment   Preterm labor symptoms and general obstetric precautions including but not  limited to vaginal bleeding, contractions, leaking of fluid and fetal movement were reviewed in detail with the patient. Please refer to After Visit Summary for other counseling recommendations.     Future Appointments  Date Time Provider Department Center  11/17/2024  8:15 AM Vannie Cornell JONELLE EDDY [facility]  12/13/2024  9:15 AM [facility] PROVIDER 1 [facility]  12/13/2024  9:30 AM [facility] US3 [facility]  12/15/2024  8:20 AM W[facility] LAB [facility]  12/15/2024  9:15 AM Vannie Cornell JONELLE, CNM [facility]  12/29/2024  9:35 AM Vannie Cornell JONELLE, CNM [facility]  01/12/2025  9:35 AM Vannie Cornell JONELLE, CNM [facility]  01/26/2025 10:55 AM Vannie Cornell JONELLE, CNM [facility]  02/09/2025 10:15 AM Vannie Cornell JONELLE, CNM [facility]  02/16/2025  3:15 PM Vannie Cornell JONELLE EDDY [facility]  02/23/2025  3:15 PM Vannie Cornell JONELLE EDDY [facility]  03/02/2025  3:15 PM Vannie Cornell JONELLE EDDY [facility]  03/09/2025  3:15 PM  Vannie Cornell JONELLE, CNM [facility]    Mitzie JINNY Molly, CNM  "

## 2024-11-17 ENCOUNTER — Encounter: Admitting: Certified Nurse Midwife

## 2024-12-13 ENCOUNTER — Other Ambulatory Visit

## 2024-12-13 ENCOUNTER — Ambulatory Visit

## 2024-12-15 ENCOUNTER — Other Ambulatory Visit

## 2024-12-15 ENCOUNTER — Encounter: Admitting: Certified Nurse Midwife

## 2024-12-29 ENCOUNTER — Encounter: Admitting: Certified Nurse Midwife

## 2025-01-12 ENCOUNTER — Encounter: Admitting: Certified Nurse Midwife

## 2025-01-26 ENCOUNTER — Encounter: Admitting: Certified Nurse Midwife

## 2025-02-09 ENCOUNTER — Encounter: Admitting: Certified Nurse Midwife

## 2025-02-16 ENCOUNTER — Encounter: Admitting: Certified Nurse Midwife

## 2025-02-23 ENCOUNTER — Encounter: Admitting: Certified Nurse Midwife

## 2025-03-02 ENCOUNTER — Encounter: Admitting: Certified Nurse Midwife

## 2025-03-09 ENCOUNTER — Encounter: Admitting: Certified Nurse Midwife
